# Patient Record
Sex: Female | Born: 1965 | Race: White | Hispanic: No | Smoking: Current every day smoker
Health system: Southern US, Community
[De-identification: ages and names within clinical notes are randomized; demographics above are authoritative.]

## PROBLEM LIST (undated history)

## (undated) DIAGNOSIS — K219 Gastro-esophageal reflux disease without esophagitis: Secondary | ICD-10-CM

## (undated) DIAGNOSIS — N809 Endometriosis, unspecified: Secondary | ICD-10-CM

## (undated) DIAGNOSIS — K2950 Unspecified chronic gastritis without bleeding: Secondary | ICD-10-CM

## (undated) DIAGNOSIS — E079 Disorder of thyroid, unspecified: Secondary | ICD-10-CM

## (undated) DIAGNOSIS — J40 Bronchitis, not specified as acute or chronic: Secondary | ICD-10-CM

## (undated) DIAGNOSIS — IMO0002 Reserved for concepts with insufficient information to code with codable children: Secondary | ICD-10-CM

## (undated) DIAGNOSIS — Z9889 Other specified postprocedural states: Secondary | ICD-10-CM

## (undated) DIAGNOSIS — G4700 Insomnia, unspecified: Secondary | ICD-10-CM

## (undated) DIAGNOSIS — F419 Anxiety disorder, unspecified: Secondary | ICD-10-CM

## (undated) DIAGNOSIS — J189 Pneumonia, unspecified organism: Secondary | ICD-10-CM

## (undated) DIAGNOSIS — G8929 Other chronic pain: Secondary | ICD-10-CM

## (undated) DIAGNOSIS — R112 Nausea with vomiting, unspecified: Secondary | ICD-10-CM

## (undated) DIAGNOSIS — N200 Calculus of kidney: Secondary | ICD-10-CM

## (undated) DIAGNOSIS — F1910 Other psychoactive substance abuse, uncomplicated: Secondary | ICD-10-CM

## (undated) HISTORY — PX: KNEE ARTHROPLASTY: SHX992

## (undated) HISTORY — PX: OTHER SURGICAL HISTORY: SHX169

## (undated) HISTORY — PX: ABDOMINAL HYSTERECTOMY: SHX81

## (undated) HISTORY — PX: FINGER ARTHRODESIS: SHX5000

## (undated) HISTORY — PX: CHOLECYSTECTOMY: SHX55

## (undated) HISTORY — PX: APPENDECTOMY: SHX54

---

## 2012-02-24 ENCOUNTER — Emergency Department: Payer: Self-pay | Admitting: Emergency Medicine

## 2012-02-25 LAB — COMPREHENSIVE METABOLIC PANEL
Albumin: 3.4 g/dL (ref 3.4–5.0)
Alkaline Phosphatase: 138 U/L — ABNORMAL HIGH (ref 50–136)
Calcium, Total: 8.3 mg/dL — ABNORMAL LOW (ref 8.5–10.1)
Chloride: 104 mmol/L (ref 98–107)
Co2: 23 mmol/L (ref 21–32)
Osmolality: 273 (ref 275–301)
Potassium: 3.5 mmol/L (ref 3.5–5.1)
SGOT(AST): 18 U/L (ref 15–37)
Sodium: 137 mmol/L (ref 136–145)
Total Protein: 6.9 g/dL (ref 6.4–8.2)

## 2012-02-25 LAB — DRUG SCREEN, URINE
Amphetamines, Ur Screen: NEGATIVE (ref ?–1000)
Barbiturates, Ur Screen: NEGATIVE (ref ?–200)
Cannabinoid 50 Ng, Ur ~~LOC~~: POSITIVE (ref ?–50)
MDMA (Ecstasy)Ur Screen: POSITIVE (ref ?–500)
Methadone, Ur Screen: NEGATIVE (ref ?–300)
Opiate, Ur Screen: NEGATIVE (ref ?–300)
Tricyclic, Ur Screen: POSITIVE (ref ?–1000)

## 2012-02-25 LAB — URINALYSIS, COMPLETE
Bilirubin,UR: NEGATIVE
Glucose,UR: NEGATIVE mg/dL (ref 0–75)
Ketone: NEGATIVE
Nitrite: NEGATIVE
Ph: 6 (ref 4.5–8.0)
Protein: NEGATIVE
Specific Gravity: 1.008 (ref 1.003–1.030)

## 2012-02-25 LAB — CBC
Platelet: 184 10*3/uL (ref 150–440)
RBC: 3.9 10*6/uL (ref 3.80–5.20)
RDW: 14.4 % (ref 11.5–14.5)
WBC: 7.6 10*3/uL (ref 3.6–11.0)

## 2012-02-25 LAB — CK TOTAL AND CKMB (NOT AT ARMC): CK-MB: 1.4 ng/mL (ref 0.5–3.6)

## 2012-03-30 ENCOUNTER — Encounter (HOSPITAL_BASED_OUTPATIENT_CLINIC_OR_DEPARTMENT_OTHER): Payer: Self-pay | Admitting: *Deleted

## 2012-03-30 ENCOUNTER — Emergency Department (HOSPITAL_BASED_OUTPATIENT_CLINIC_OR_DEPARTMENT_OTHER)
Admission: EM | Admit: 2012-03-30 | Discharge: 2012-03-30 | Disposition: A | Discharge status: Home / Self Care | Payer: Self-pay | Attending: Emergency Medicine | Admitting: Emergency Medicine

## 2012-03-30 DIAGNOSIS — N898 Other specified noninflammatory disorders of vagina: Secondary | ICD-10-CM | POA: Insufficient documentation

## 2012-03-30 DIAGNOSIS — Z79899 Other long term (current) drug therapy: Secondary | ICD-10-CM | POA: Insufficient documentation

## 2012-03-30 DIAGNOSIS — Z8659 Personal history of other mental and behavioral disorders: Secondary | ICD-10-CM | POA: Insufficient documentation

## 2012-03-30 DIAGNOSIS — Z90710 Acquired absence of both cervix and uterus: Secondary | ICD-10-CM | POA: Insufficient documentation

## 2012-03-30 DIAGNOSIS — R109 Unspecified abdominal pain: Secondary | ICD-10-CM | POA: Insufficient documentation

## 2012-03-30 HISTORY — DX: Anxiety disorder, unspecified: F41.9

## 2012-03-30 LAB — URINALYSIS, ROUTINE W REFLEX MICROSCOPIC
Bilirubin Urine: NEGATIVE
Glucose, UA: NEGATIVE mg/dL
Ketones, ur: NEGATIVE mg/dL
Protein, ur: NEGATIVE mg/dL

## 2012-03-30 LAB — URINE MICROSCOPIC-ADD ON

## 2012-03-30 NOTE — ED Notes (Signed)
Pt. Reports she has not had a period since 2009.  Pt. Reports hysterectomy in 2009.  Pt. Is color is WNL

## 2012-03-30 NOTE — ED Notes (Signed)
Vaginal bleeding started after having sex earlier today. States she feels like her bladder is falling out.

## 2012-03-30 NOTE — ED Provider Notes (Signed)
History     CSN: [id number]  Arrival date & time 03/30/12  1929   First MD Initiated Contact with Patient 03/30/12 2004      Chief Complaint  Patient presents with  . Vaginal Bleeding    (Consider location/radiation/quality/duration/timing/severity/associated sxs/prior treatment) HPI Comments: Patient with history of hysterectomy 5 years ago due to endometriosis.  Today had sex for the first time in a while.  Shortly after it began she started with bleeding from the vagina that has persisted all day.  No fever or chills.  No urinary complaints.    Patient is a 47 y.o. female presenting with vaginal bleeding. The history is provided by the patient.  Vaginal Bleeding This is a new problem. Episode onset: this afternoon. The problem occurs constantly. The problem has not changed since onset.Associated symptoms include abdominal pain. Nothing aggravates the symptoms. Nothing relieves the symptoms. She has tried nothing for the symptoms.    Past Medical History  Diagnosis Date  . Anxiety     Past Surgical History  Procedure Laterality Date  . Abdominal hysterectomy    . Cholecystectomy    . Appendectomy      No family history on file.  History  Substance Use Topics  . Smoking status: Not on file  . Smokeless tobacco: Not on file  . Alcohol Use: Not on file    OB History   Grav Para Term Preterm Abortions TAB SAB Ect Mult Living                  Review of Systems  Gastrointestinal: Positive for abdominal pain.  Genitourinary: Positive for vaginal bleeding.  All other systems reviewed and are negative.    Allergies  Review of patient's allergies indicates not on file.  Home Medications   Current Outpatient Rx  Name  Route  Sig  Dispense  Refill  . Methocarbamol (ROBAXIN PO)   Oral   Take by mouth.         . QUEtiapine Fumarate (SEROQUEL PO)   Oral   Take by mouth.         . TRAZODONE HCL PO   Oral   Take by mouth.           BP 142/92  Pulse  100  Temp(Src) 98.1 F (36.7 C) (Oral)  Resp 18  Wt 145 lb (65.772 kg)  SpO2 100%  Physical Exam  Nursing note and vitals reviewed. Constitutional: She is oriented to person, place, and time. She appears well-developed and well-nourished. No distress.  HENT:  Head: Normocephalic and atraumatic.  Neck: Normal range of motion. Neck supple.  Cardiovascular: Normal rate and regular rhythm.  Exam reveals no gallop and no friction rub.   No murmur heard. Pulmonary/Chest: Effort normal and breath sounds normal. No respiratory distress. She has no wheezes.  Abdominal: Soft. Bowel sounds are normal. She exhibits no distension. There is no tenderness.  Genitourinary: Vagina normal.  There is slight old blood in the vaginal vault but no active bleeding or bright red blood.  Musculoskeletal: Normal range of motion.  Neurological: She is alert and oriented to person, place, and time.  Skin: Skin is warm and dry. She is not diaphoretic.    ED Course  Procedures (including critical care time)  Labs Reviewed  URINALYSIS, ROUTINE W REFLEX MICROSCOPIC   No results found.   No diagnosis found.    MDM  The pelvic exam shows old blood but no obvious source or active bleeding.  She was exquisitely tender with insertion of the speculum.  I do not see anything emergent at this point.  I will advise her to follow up with gyn and call tomorrow to arrange this.          Geoffery Lyons, MD 03/30/12 [phone]

## 2012-04-10 ENCOUNTER — Encounter (HOSPITAL_BASED_OUTPATIENT_CLINIC_OR_DEPARTMENT_OTHER): Payer: Self-pay

## 2012-04-10 ENCOUNTER — Other Ambulatory Visit: Payer: Self-pay

## 2012-04-10 ENCOUNTER — Emergency Department (HOSPITAL_BASED_OUTPATIENT_CLINIC_OR_DEPARTMENT_OTHER)
Admission: EM | Admit: 2012-04-10 | Discharge: 2012-04-10 | Disposition: A | Discharge status: Home / Self Care | Payer: Self-pay | Attending: Emergency Medicine | Admitting: Emergency Medicine

## 2012-04-10 ENCOUNTER — Emergency Department (HOSPITAL_BASED_OUTPATIENT_CLINIC_OR_DEPARTMENT_OTHER): Payer: Self-pay

## 2012-04-10 DIAGNOSIS — Z862 Personal history of diseases of the blood and blood-forming organs and certain disorders involving the immune mechanism: Secondary | ICD-10-CM | POA: Insufficient documentation

## 2012-04-10 DIAGNOSIS — R000 Tachycardia, unspecified: Secondary | ICD-10-CM

## 2012-04-10 DIAGNOSIS — M545 Low back pain, unspecified: Secondary | ICD-10-CM | POA: Insufficient documentation

## 2012-04-10 DIAGNOSIS — F1911 Other psychoactive substance abuse, in remission: Secondary | ICD-10-CM

## 2012-04-10 DIAGNOSIS — Z79899 Other long term (current) drug therapy: Secondary | ICD-10-CM | POA: Insufficient documentation

## 2012-04-10 DIAGNOSIS — Z8639 Personal history of other endocrine, nutritional and metabolic disease: Secondary | ICD-10-CM | POA: Insufficient documentation

## 2012-04-10 DIAGNOSIS — R002 Palpitations: Secondary | ICD-10-CM

## 2012-04-10 DIAGNOSIS — G8929 Other chronic pain: Secondary | ICD-10-CM

## 2012-04-10 DIAGNOSIS — F17200 Nicotine dependence, unspecified, uncomplicated: Secondary | ICD-10-CM | POA: Insufficient documentation

## 2012-04-10 DIAGNOSIS — R079 Chest pain, unspecified: Secondary | ICD-10-CM

## 2012-04-10 DIAGNOSIS — Z8659 Personal history of other mental and behavioral disorders: Secondary | ICD-10-CM | POA: Insufficient documentation

## 2012-04-10 HISTORY — DX: Disorder of thyroid, unspecified: E07.9

## 2012-04-10 HISTORY — DX: Other psychoactive substance abuse, uncomplicated: F19.10

## 2012-04-10 LAB — CBC WITH DIFFERENTIAL/PLATELET
Basophils Relative: 0 % (ref 0–1)
Eosinophils Relative: 1 % (ref 0–5)
Lymphocytes Relative: 30 % (ref 12–46)
Lymphs Abs: 1.9 10*3/uL (ref 0.7–4.0)
MCH: 30.7 pg (ref 26.0–34.0)
Monocytes Absolute: 0.4 10*3/uL (ref 0.1–1.0)
Neutrophils Relative %: 63 % (ref 43–77)
Platelets: 254 10*3/uL (ref 150–400)
RBC: 4.27 MIL/uL (ref 3.87–5.11)
RDW: 14.3 % (ref 11.5–15.5)
WBC: 6.4 10*3/uL (ref 4.0–10.5)

## 2012-04-10 LAB — URINALYSIS, ROUTINE W REFLEX MICROSCOPIC
Leukocytes, UA: NEGATIVE
Nitrite: NEGATIVE
Specific Gravity, Urine: 1.01 (ref 1.005–1.030)
pH: 7.5 (ref 5.0–8.0)

## 2012-04-10 LAB — BASIC METABOLIC PANEL
Calcium: 10 mg/dL (ref 8.4–10.5)
GFR calc Af Amer: >90 mL/min (ref >90)
GFR calc non Af Amer: >90 mL/min (ref >90)
Potassium: 3.2 mEq/L — ABNORMAL LOW (ref 3.5–5.1)
Sodium: 141 mEq/L (ref 135–145)
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
## 2012-04-10 NOTE — ED Notes (Signed)
Marcello Moores from lab called and sts cbc was clotted.

## 2012-04-10 NOTE — ED Notes (Addendum)
C/o "heart racing" and bilat flank pain x 4-5 days-pt states she was on thyroid med while in rehab then was taken off while in rehab approx 1 .5 months ago

## 2012-07-13 ENCOUNTER — Emergency Department (HOSPITAL_COMMUNITY)
Admission: EM | Admit: 2012-07-13 | Discharge: 2012-07-13 | Disposition: A | Discharge status: Home / Self Care | Payer: Self-pay | Attending: Emergency Medicine | Admitting: Emergency Medicine

## 2012-07-13 ENCOUNTER — Emergency Department (HOSPITAL_COMMUNITY): Payer: Self-pay

## 2012-07-13 ENCOUNTER — Encounter (HOSPITAL_COMMUNITY): Payer: Self-pay | Admitting: *Deleted

## 2012-07-13 DIAGNOSIS — Z87442 Personal history of urinary calculi: Secondary | ICD-10-CM | POA: Insufficient documentation

## 2012-07-13 DIAGNOSIS — Z90710 Acquired absence of both cervix and uterus: Secondary | ICD-10-CM | POA: Insufficient documentation

## 2012-07-13 DIAGNOSIS — Z8639 Personal history of other endocrine, nutritional and metabolic disease: Secondary | ICD-10-CM | POA: Insufficient documentation

## 2012-07-13 DIAGNOSIS — Z9089 Acquired absence of other organs: Secondary | ICD-10-CM | POA: Insufficient documentation

## 2012-07-13 DIAGNOSIS — F411 Generalized anxiety disorder: Secondary | ICD-10-CM | POA: Insufficient documentation

## 2012-07-13 DIAGNOSIS — F17200 Nicotine dependence, unspecified, uncomplicated: Secondary | ICD-10-CM | POA: Insufficient documentation

## 2012-07-13 DIAGNOSIS — Z79899 Other long term (current) drug therapy: Secondary | ICD-10-CM | POA: Insufficient documentation

## 2012-07-13 DIAGNOSIS — Z862 Personal history of diseases of the blood and blood-forming organs and certain disorders involving the immune mechanism: Secondary | ICD-10-CM | POA: Insufficient documentation

## 2012-07-13 DIAGNOSIS — R112 Nausea with vomiting, unspecified: Secondary | ICD-10-CM | POA: Insufficient documentation

## 2012-07-13 DIAGNOSIS — R109 Unspecified abdominal pain: Secondary | ICD-10-CM | POA: Insufficient documentation

## 2012-07-13 DIAGNOSIS — Z880 Allergy status to penicillin: Secondary | ICD-10-CM | POA: Insufficient documentation

## 2012-07-13 LAB — CBC WITH DIFFERENTIAL/PLATELET
Basophils Relative: 0 % (ref 0–1)
Eosinophils Absolute: 0.1 10*3/uL (ref 0.0–0.7)
HCT: 43.2 % (ref 36.0–46.0)
Hemoglobin: 15.1 g/dL — ABNORMAL HIGH (ref 12.0–15.0)
MCH: 30.9 pg (ref 26.0–34.0)
MCHC: 35 g/dL (ref 30.0–36.0)
Monocytes Absolute: 0.5 10*3/uL (ref 0.1–1.0)
Monocytes Relative: 6 % (ref 3–12)
Neutrophils Relative %: 49 % (ref 43–77)
RDW: 12.6 % (ref 11.5–15.5)

## 2012-07-13 LAB — POCT I-STAT, CHEM 8
Calcium, Ion: 1.2 mmol/L (ref 1.12–1.23)
Creatinine, Ser: 0.9 mg/dL (ref 0.50–1.10)
Glucose, Bld: 93 mg/dL (ref 70–99)
Hemoglobin: 15.6 g/dL — ABNORMAL HIGH (ref 12.0–15.0)
TCO2: 23 mmol/L (ref 0–100)

## 2012-07-13 LAB — URINALYSIS, ROUTINE W REFLEX MICROSCOPIC
Bilirubin Urine: NEGATIVE
Protein, ur: NEGATIVE mg/dL
Urobilinogen, UA: 0.2 mg/dL (ref 0.0–1.0)

## 2012-07-13 MED ORDER — HYDROMORPHONE HCL PF 1 MG/ML IJ SOLN
1.0000 mg | Freq: Once | INTRAMUSCULAR | Status: AC
  Administered 2012-07-13: 1 mg via INTRAVENOUS
  Filled 2012-07-13: qty 1

## 2012-07-13 MED ORDER — KETOROLAC TROMETHAMINE 30 MG/ML IJ SOLN
30.0000 mg | Freq: Once | INTRAMUSCULAR | Status: AC
  Administered 2012-07-13: 30 mg via INTRAVENOUS
  Filled 2012-07-13: qty 1

## 2012-07-13 MED ORDER — SODIUM CHLORIDE 0.9 % IV SOLN
Freq: Once | INTRAVENOUS | Status: AC
  Administered 2012-07-13: 17:00:00 via INTRAVENOUS

## 2012-07-13 MED ORDER — NAPROXEN 500 MG PO TABS: 500.0000 mg | ORAL_TABLET | Freq: Two times a day (BID) | ORAL | Status: DC

## 2012-07-13 MED ORDER — METOCLOPRAMIDE HCL 5 MG/ML IJ SOLN
10.0000 mg | Freq: Once | INTRAMUSCULAR | Status: AC
  Administered 2012-07-13: 10 mg via INTRAVENOUS
  Filled 2012-07-13: qty 2

## 2012-07-13 MED ORDER — HYDROCODONE-ACETAMINOPHEN 5-325 MG PO TABS: 1.0000 | ORAL_TABLET | Freq: Four times a day (QID) | ORAL | Status: DC | PRN

## 2012-07-13 MED ORDER — PROMETHAZINE HCL 25 MG/ML IJ SOLN
12.5000 mg | Freq: Once | INTRAMUSCULAR | Status: AC
  Administered 2012-07-13: 12.5 mg via INTRAVENOUS
  Filled 2012-07-13 (×2): qty 1

## 2012-07-13 MED ORDER — PROMETHAZINE HCL 25 MG PO TABS: 25.0000 mg | ORAL_TABLET | Freq: Four times a day (QID) | ORAL | Status: DC | PRN

## 2012-07-13 NOTE — ED Provider Notes (Signed)
History     CSN: [id number]  Arrival date & time 07/13/12  1549   First MD Initiated Contact with Patient 07/13/12 1610      Chief Complaint  Patient presents with  . Flank Pain    (Consider location/radiation/quality/duration/timing/severity/associated sxs/prior treatment) HPI [name] [name] is a 47 y.o. female who presents to ED with complaint of right flank pain radiating into right groin onset today. States pain is sharp, colicky. Took lortab with no improvement. Hx of kindey stones, feels the same. Admits to nausea, vomiting. Denies urinary symptoms, denies fever, no change in bowels. Pt has had multiple surgeries including appendectomy, cholecystectomy, hysterectomy. No other complaints. Nothing making symptoms better or worse.    Past Medical History  Diagnosis Date  . Anxiety   . Thyroid disease   . Substance abuse     Past Surgical History  Procedure Laterality Date  . Abdominal hysterectomy    . Cholecystectomy    . Appendectomy      No family history on file.  History  Substance Use Topics  . Smoking status: Current Every Day Smoker  . Smokeless tobacco: Not on file  . Alcohol Use: Yes    OB History   Grav Para Term Preterm Abortions TAB SAB Ect Mult Living                  Review of Systems  Constitutional: Negative for fever and chills.  Respiratory: Negative.   Cardiovascular: Negative.   Gastrointestinal: Positive for nausea, vomiting and abdominal pain. Negative for diarrhea, constipation and blood in stool.  Genitourinary: Positive for flank pain. Negative for dysuria, hematuria, vaginal bleeding, vaginal discharge, difficulty urinating and vaginal pain.  All other systems reviewed and are negative.    Allergies  Sulfa antibiotics; Zofran; Clindamycin/lincomycin; Morphine and related; and Penicillins  Home Medications   Current Outpatient Rx  Name  Route  Sig  Dispense  Refill  . HYDROcodone-acetaminophen (NORCO/VICODIN) 5-325 MG per  tablet   Oral   Take 1 tablet by mouth every 6 (six) hours as needed for pain.         Marland Kitchen QUEtiapine Fumarate (SEROQUEL PO)   Oral   Take 50 mg by mouth 2 (two) times daily.            BP 149/97  Pulse 115  Temp(Src) 98.3 F (36.8 C) (Oral)  Resp 20  SpO2 100%  Physical Exam  Nursing note and vitals reviewed. Constitutional: She is oriented to person, place, and time. She appears well-developed and well-nourished. No distress.  HENT:  Head: Normocephalic.  Eyes: Conjunctivae are normal.  Neck: Neck supple.  Cardiovascular: Normal rate, regular rhythm and normal heart sounds.   Pulmonary/Chest: Effort normal and breath sounds normal. No respiratory distress. She has no wheezes. She has no rales.  Abdominal: Soft. Bowel sounds are normal. She exhibits no distension. There is no tenderness. There is no rebound.  RUQ tenderness, right CVA tenderness  Neurological: She is alert and oriented to person, place, and time.  Skin: Skin is warm and dry.    ED Course  Procedures (including critical care time)  Pt with right flank pain, appears in a lot of pain. Hx of kindey stones. Ua, labs, ct abd/pelvis ordered.   Results for orders placed during the hospital encounter of 07/13/12  CBC WITH DIFFERENTIAL      Result Value Range   WBC 7.1  4.0 - 10.5 K/uL   RBC 4.89  3.87 - 5.11 MIL/uL  Hemoglobin 15.1 (*) 12.0 - 15.0 g/dL   HCT 40.9  81.1 - 91.4 %   MCV 88.3  78.0 - 100.0 fL   MCH 30.9  26.0 - 34.0 pg   MCHC 35.0  30.0 - 36.0 g/dL   RDW 78.2  95.6 - 21.3 %   Platelets 305  150 - 400 K/uL   Neutrophils Relative % 49  43 - 77 %   Neutro Abs 3.5  1.7 - 7.7 K/uL   Lymphocytes Relative 44  12 - 46 %   Lymphs Abs 3.1  0.7 - 4.0 K/uL   Monocytes Relative 6  3 - 12 %   Monocytes Absolute 0.5  0.1 - 1.0 K/uL   Eosinophils Relative 1  0 - 5 %   Eosinophils Absolute 0.1  0.0 - 0.7 K/uL   Basophils Relative 0  0 - 1 %   Basophils Absolute 0.0  0.0 - 0.1 K/uL  URINALYSIS,  ROUTINE W REFLEX MICROSCOPIC      Result Value Range   Color, Urine YELLOW  YELLOW   APPearance CLEAR  CLEAR   Specific Gravity, Urine 1.012  1.005 - 1.030   pH 6.0  5.0 - 8.0   Glucose, UA NEGATIVE  NEGATIVE mg/dL   Hgb urine dipstick TRACE (*) NEGATIVE   Bilirubin Urine NEGATIVE  NEGATIVE   Ketones, ur NEGATIVE  NEGATIVE mg/dL   Protein, ur NEGATIVE  NEGATIVE mg/dL   Urobilinogen, UA 0.2  0.0 - 1.0 mg/dL   Nitrite NEGATIVE  NEGATIVE   Leukocytes, UA NEGATIVE  NEGATIVE  URINE MICROSCOPIC-ADD ON      Result Value Range   Squamous Epithelial / LPF RARE  RARE   RBC / HPF 0-2  <3 RBC/hpf   Bacteria, UA FEW (*) RARE  POCT I-STAT, CHEM 8      Result Value Range   Sodium 138  135 - 145 mEq/L   Potassium 4.8  3.5 - 5.1 mEq/L   Chloride 107  96 - 112 mEq/L   BUN 11  6 - 23 mg/dL   Creatinine, Ser 0.86  0.50 - 1.10 mg/dL   Glucose, Bld 93  70 - 99 mg/dL   Calcium, Ion 5.78  1.12 - 1.23 mmol/L   TCO2 23  0 - 100 mmol/L   Hemoglobin 15.6 (*) 12.0 - 15.0 g/dL   HCT 46.9  62.9 - 52.8 %   Ct Abdomen Pelvis Wo Contrast  07/13/2012   *RADIOLOGY REPORT*  Clinical Data: Right flank pain  CT ABDOMEN AND PELVIS WITHOUT CONTRAST  Technique:  Multidetector CT imaging of the abdomen and pelvis was performed following the standard protocol without intravenous contrast.  Comparison: Prior CT urogram 04/19/2008  Findings:  Lower Chest:  Lung bases are clear.  Visualized cardiac structures within normal limits for size.  No pericardial effusion. Unremarkable distal thoracic esophagus.  Abdomen: Unenhanced CT was performed per clinician order.  Lack of IV contrast limits sensitivity and specificity, especially for evaluation of abdominal/pelvic solid viscera.  Within these limitations, unremarkable CT appearance of the stomach, duodenum, spleen, adrenal glands and pancreas.  No focal hepatic lesion. Trace geographic hypoattenuation in the medial segment the left hepatic lobe adjacent to the fissure for the  falciform ligament likely reflects focal fatty infiltration.  Status post cholecystectomy.  Unremarkable appearance of the kidneys bilaterally.  No hydronephrosis or nephrolithiasis.  No focal renal contour abnormality.  Normal-caliber large and small bowel throughout the abdomen.  No evidence of obstruction.  No significant diverticular disease. Surgical clips at the base of the cecum suggest prior appendectomy. Mild fecalization of the terminal ileum.  No free fluid or suspicious adenopathy.  Pelvis: The bladder is distant with urine.  Surgical changes of prior hysterectomy.  No free fluid or suspicious adenopathy.  Bones: No acute fracture or aggressive appearing lytic or blastic osseous lesion.  Subacute or remote fracture of the right eleventh rib.  Vascular: No significant atherosclerotic vascular disease or other acute abnormality.  IMPRESSION:  1.  No acute findings in the abdomen or pelvis to explain the patient's clinical symptoms.  Specifically, no renal or ureteral stones.  2.  Surgical changes of prior cholecystectomy, appendectomy and hysterectomy.   Original Report Authenticated By: Malachy Moan, M.D.      1. Flank pain       MDM  Pt's labs, ua, ct unremarkable. Pt's pain controled with dilaudid and phenergan in ED. Given normal bowels on non contrasted scan, no hydronephrosis, no visible kidney stones, hx of cholecystectomy and appendectomy, suspect source of pain is musculoskeletal. Will start on nsaids, norco, phenergan, follow up.   Filed Vitals:   07/13/12 1601  BP: 149/97  Pulse: 115  Temp: 98.3 F (36.8 C)  TempSrc: Oral  Resp: 20  SpO2: 100%         Lottie Mussel, PA-C 07/13/12 1908

## 2012-07-13 NOTE — ED Notes (Signed)
Pt reports R flank pain radiating down to he R groin which started this am.  Pt has hx of kidney stone in the past-same side.  Pt reports calcification in her R kidney.  Pt reports taking one lortab x 3 hours ago.

## 2012-07-13 NOTE — ED Provider Notes (Signed)
Medical screening examination/treatment/procedure(s) were performed by non-physician practitioner and as supervising physician I was immediately available for consultation/collaboration.   Clary Boulais, MD 07/13/12 2246 

## 2012-07-14 ENCOUNTER — Encounter (HOSPITAL_COMMUNITY): Payer: Self-pay | Admitting: Emergency Medicine

## 2012-07-14 ENCOUNTER — Emergency Department (HOSPITAL_COMMUNITY)
Admission: EM | Admit: 2012-07-14 | Discharge: 2012-07-14 | Disposition: A | Discharge status: Home / Self Care | Payer: Self-pay | Attending: Emergency Medicine | Admitting: Emergency Medicine

## 2012-07-14 DIAGNOSIS — F17200 Nicotine dependence, unspecified, uncomplicated: Secondary | ICD-10-CM | POA: Insufficient documentation

## 2012-07-14 DIAGNOSIS — Z8639 Personal history of other endocrine, nutritional and metabolic disease: Secondary | ICD-10-CM | POA: Insufficient documentation

## 2012-07-14 DIAGNOSIS — X58XXXA Exposure to other specified factors, initial encounter: Secondary | ICD-10-CM | POA: Insufficient documentation

## 2012-07-14 DIAGNOSIS — S00209A Unspecified superficial injury of unspecified eyelid and periocular area, initial encounter: Secondary | ICD-10-CM | POA: Insufficient documentation

## 2012-07-14 DIAGNOSIS — F1910 Other psychoactive substance abuse, uncomplicated: Secondary | ICD-10-CM | POA: Insufficient documentation

## 2012-07-14 DIAGNOSIS — Z880 Allergy status to penicillin: Secondary | ICD-10-CM | POA: Insufficient documentation

## 2012-07-14 DIAGNOSIS — Y9301 Activity, walking, marching and hiking: Secondary | ICD-10-CM | POA: Insufficient documentation

## 2012-07-14 DIAGNOSIS — Z79899 Other long term (current) drug therapy: Secondary | ICD-10-CM | POA: Insufficient documentation

## 2012-07-14 DIAGNOSIS — Z8659 Personal history of other mental and behavioral disorders: Secondary | ICD-10-CM | POA: Insufficient documentation

## 2012-07-14 DIAGNOSIS — Y9289 Other specified places as the place of occurrence of the external cause: Secondary | ICD-10-CM | POA: Insufficient documentation

## 2012-07-14 DIAGNOSIS — S00212A Abrasion of left eyelid and periocular area, initial encounter: Secondary | ICD-10-CM

## 2012-07-14 DIAGNOSIS — Z862 Personal history of diseases of the blood and blood-forming organs and certain disorders involving the immune mechanism: Secondary | ICD-10-CM | POA: Insufficient documentation

## 2012-07-14 MED ORDER — TETRACAINE HCL 0.5 % OP SOLN
1.0000 [drp] | Freq: Once | OPHTHALMIC | Status: AC
  Administered 2012-07-14: 1 [drp] via OPHTHALMIC
  Filled 2012-07-14: qty 2

## 2012-07-14 MED ORDER — ERYTHROMYCIN 5 MG/GM OP OINT
TOPICAL_OINTMENT | Freq: Four times a day (QID) | OPHTHALMIC | Status: DC
  Administered 2012-07-14: 11:00:00 via OPHTHALMIC
  Filled 2012-07-14: qty 3.5

## 2012-07-14 MED ORDER — OXYBUTYNIN CHLORIDE ER 5 MG PO TB24: 5.0000 mg | ORAL_TABLET | Freq: Every day | ORAL | Status: DC

## 2012-07-14 NOTE — ED Notes (Signed)
Pt sts that she was walking and she walked into a bush. Pt sts that she thinks something is in her L eye. Pt has pain in this eye without any trouble seeing.

## 2012-07-14 NOTE — ED Provider Notes (Signed)
  Medical screening examination/treatment/procedure(s) were performed by non-physician practitioner and as supervising physician I was immediately available for consultation/collaboration.   Walton Digilio, MD 07/14/12 1629 

## 2012-07-14 NOTE — ED Provider Notes (Signed)
History     CSN: [id number]  Arrival date & time 07/14/12  1035   First MD Initiated Contact with Patient 07/14/12 1052      Chief Complaint  Patient presents with  . Eye Pain    (Consider location/radiation/quality/duration/timing/severity/associated sxs/prior treatment) HPI Comments: Pt states that she turned and scraped her eye on a bush:her left eye is tearing and it feels like something is in it:pt is also requesting script for ditropan until she can see her doctor  Patient is a 47 y.o. female presenting with eye pain. The history is provided by the patient. No language interpreter was used.  Eye Pain This is a new problem. The current episode started yesterday. The problem occurs constantly. The problem has been unchanged. Pertinent negatives include no fever. Nothing aggravates the symptoms. Treatments tried: rinsing.    Past Medical History  Diagnosis Date  . Anxiety   . Thyroid disease   . Substance abuse     Past Surgical History  Procedure Laterality Date  . Abdominal hysterectomy    . Cholecystectomy    . Appendectomy      No family history on file.  History  Substance Use Topics  . Smoking status: Current Every Day Smoker  . Smokeless tobacco: Not on file  . Alcohol Use: Yes    OB History   Grav Para Term Preterm Abortions TAB SAB Ect Mult Living                  Review of Systems  Constitutional: Negative for fever.  Eyes: Positive for pain.  Respiratory: Negative.   Cardiovascular: Negative.     Allergies  Sulfa antibiotics; Zofran; Clindamycin/lincomycin; Morphine and related; and Penicillins  Home Medications   Current Outpatient Rx  Name  Route  Sig  Dispense  Refill  . HYDROcodone-acetaminophen (NORCO) 5-325 MG per tablet   Oral   Take 1 tablet by mouth every 6 (six) hours as needed for pain.   20 tablet   0   . HYDROcodone-acetaminophen (NORCO/VICODIN) 5-325 MG per tablet   Oral   Take 1 tablet by mouth every 6 (six) hours  as needed for pain.         . naproxen (NAPROSYN) 500 MG tablet   Oral   Take 1 tablet (500 mg total) by mouth 2 (two) times daily.   30 tablet   0   . promethazine (PHENERGAN) 25 MG tablet   Oral   Take 1 tablet (25 mg total) by mouth every 6 (six) hours as needed for nausea.   15 tablet   0   . QUEtiapine Fumarate (SEROQUEL PO)   Oral   Take 50 mg by mouth 2 (two) times daily.            BP 133/88  Pulse 85  Temp(Src) 99 F (37.2 C) (Oral)  SpO2 100%  Physical Exam  Nursing note and vitals reviewed. Constitutional: She appears well-developed and well-nourished.  Eyes: EOM are normal. Pupils are equal, round, and reactive to light. No foreign bodies found.  Slit lamp exam:      The left eye shows no corneal abrasion, no corneal ulcer, no foreign body and no fluorescein uptake.  Pt has abrasion under the left eyelid  Cardiovascular: Normal rate and regular rhythm.   Pulmonary/Chest: Effort normal and breath sounds normal.  Musculoskeletal: Normal range of motion.    ED Course  Procedures (including critical care time)  Labs Reviewed - No data to  display    1. Eyelid abrasion, left, initial encounter       MDM  Will treat with antibiotics and give ditropan refill        Teressa Lower, NP 07/14/12 1121

## 2012-07-14 NOTE — Progress Notes (Signed)
Consulted by [facility] liaison, Stacy  Provided pt with a list of uniinsured dental services for [facility] (where pt informed stacy she would be moving to)   [place], [employer] ([facility], [facility], [facility]) [place], [place] Medicaid Dentist  [facility] - [phone] [place], [place] - [address]  [place],  Medicaid Dentist  [facility]: 10.93 miles from [address] Jake Michaelis, Elbert Ewings - [phone] [place], [place] - [address]  Liana Crocker, [place] Medicaid Dentist  [facility]: 14.37 miles from [facility] - [phone] [place], [place] - [address]  [place], [place] Medicaid Dentist  [facility]: 19.93 miles from [address] Wynona Dove, [place] - [phone] [place], [place] - [address]  Henrene Dodge, [place] [facility]  [facility]: 20.00 miles from [facility] - [phone] [place], [place] - [address]  Karn Pickler, [place] Medicaid Dentist  [facility]: 25.52 miles from [address] Layla Maw, B - [phone] [place], [place] - [address]   Pt appreciative of resources provided

## 2012-07-17 ENCOUNTER — Encounter (HOSPITAL_COMMUNITY): Payer: Self-pay

## 2012-07-17 ENCOUNTER — Other Ambulatory Visit: Payer: Self-pay

## 2012-07-17 ENCOUNTER — Emergency Department (HOSPITAL_COMMUNITY): Payer: Self-pay

## 2012-07-17 ENCOUNTER — Emergency Department (HOSPITAL_COMMUNITY)
Admission: EM | Admit: 2012-07-17 | Discharge: 2012-07-17 | Disposition: A | Discharge status: Home / Self Care | Payer: Self-pay | Attending: Emergency Medicine | Admitting: Emergency Medicine

## 2012-07-17 DIAGNOSIS — Z720 Tobacco use: Secondary | ICD-10-CM

## 2012-07-17 DIAGNOSIS — Z8701 Personal history of pneumonia (recurrent): Secondary | ICD-10-CM | POA: Insufficient documentation

## 2012-07-17 DIAGNOSIS — R079 Chest pain, unspecified: Secondary | ICD-10-CM | POA: Insufficient documentation

## 2012-07-17 DIAGNOSIS — Z8742 Personal history of other diseases of the female genital tract: Secondary | ICD-10-CM | POA: Insufficient documentation

## 2012-07-17 DIAGNOSIS — R509 Fever, unspecified: Secondary | ICD-10-CM | POA: Insufficient documentation

## 2012-07-17 DIAGNOSIS — J209 Acute bronchitis, unspecified: Secondary | ICD-10-CM | POA: Insufficient documentation

## 2012-07-17 DIAGNOSIS — Z8659 Personal history of other mental and behavioral disorders: Secondary | ICD-10-CM | POA: Insufficient documentation

## 2012-07-17 DIAGNOSIS — F17200 Nicotine dependence, unspecified, uncomplicated: Secondary | ICD-10-CM | POA: Insufficient documentation

## 2012-07-17 DIAGNOSIS — Z87442 Personal history of urinary calculi: Secondary | ICD-10-CM | POA: Insufficient documentation

## 2012-07-17 DIAGNOSIS — IMO0002 Reserved for concepts with insufficient information to code with codable children: Secondary | ICD-10-CM | POA: Insufficient documentation

## 2012-07-17 DIAGNOSIS — F411 Generalized anxiety disorder: Secondary | ICD-10-CM | POA: Insufficient documentation

## 2012-07-17 DIAGNOSIS — Z8639 Personal history of other endocrine, nutritional and metabolic disease: Secondary | ICD-10-CM | POA: Insufficient documentation

## 2012-07-17 DIAGNOSIS — Z862 Personal history of diseases of the blood and blood-forming organs and certain disorders involving the immune mechanism: Secondary | ICD-10-CM | POA: Insufficient documentation

## 2012-07-17 DIAGNOSIS — Z79899 Other long term (current) drug therapy: Secondary | ICD-10-CM | POA: Insufficient documentation

## 2012-07-17 DIAGNOSIS — J40 Bronchitis, not specified as acute or chronic: Secondary | ICD-10-CM

## 2012-07-17 HISTORY — DX: Calculus of kidney: N20.0

## 2012-07-17 HISTORY — DX: Endometriosis, unspecified: N80.9

## 2012-07-17 HISTORY — DX: Pneumonia, unspecified organism: J18.9

## 2012-07-17 HISTORY — DX: Bronchitis, not specified as acute or chronic: J40

## 2012-07-17 MED ORDER — ALBUTEROL SULFATE HFA 108 (90 BASE) MCG/ACT IN AERS
2.0000 | INHALATION_SPRAY | RESPIRATORY_TRACT | Status: DC | PRN
  Administered 2012-07-17: 2 via RESPIRATORY_TRACT
  Filled 2012-07-17: qty 6.7

## 2012-07-17 MED ORDER — AZITHROMYCIN 250 MG PO TABS: ORAL_TABLET | ORAL | Status: DC

## 2012-07-17 MED ORDER — HYDROCOD POLST-CHLORPHEN POLST 10-8 MG/5ML PO LQCR: 5.0000 mL | Freq: Two times a day (BID) | ORAL | Status: DC

## 2012-07-17 MED ORDER — PREDNISONE 20 MG PO TABS
60.0000 mg | ORAL_TABLET | Freq: Once | ORAL | Status: AC
  Administered 2012-07-17: 60 mg via ORAL
  Filled 2012-07-17: qty 3

## 2012-07-17 MED ORDER — HYDROCODONE-ACETAMINOPHEN 5-325 MG PO TABS
1.0000 | ORAL_TABLET | Freq: Once | ORAL | Status: AC
  Administered 2012-07-17: 1 via ORAL
  Filled 2012-07-17: qty 1

## 2012-07-17 MED ORDER — AEROCHAMBER Z-STAT PLUS/MEDIUM MISC
1.0000 | Freq: Once | Status: AC
  Administered 2012-07-17: 1
  Filled 2012-07-17: qty 1

## 2012-07-17 MED ORDER — ONDANSETRON 8 MG PO TBDP
8.0000 mg | ORAL_TABLET | Freq: Once | ORAL | Status: AC
  Administered 2012-07-17: 8 mg via ORAL
  Filled 2012-07-17: qty 1

## 2012-07-17 MED ORDER — PREDNISONE 20 MG PO TABS: 20.0000 mg | ORAL_TABLET | Freq: Two times a day (BID) | ORAL | Status: DC

## 2012-07-17 NOTE — ED Provider Notes (Signed)
History     CSN: [id number]  Arrival date & time 07/17/12  6578   First MD Initiated Contact with Patient 07/17/12 1045      Chief Complaint  Patient presents with  . Fever  . Cough  . Chest Pain    (Consider location/radiation/quality/duration/timing/severity/associated sxs/prior treatment) HPI Comments: [name] [name] is a 47 y.o. Female who presents for evaluation of a productive cough for 3 days. She has had nausea and 2 episodes of vomiting for one day. She has mild, chest pain that is worse with coughing. She tried the [employer], but it did not help her cough. She has had history of similar problems with both bronchitis, and pneumonia. She continues to smoke one pack per day. She denies shortness of breath, weakness, dizziness, headache, or back pain. The vomiting seemed to be caused by coughing.There are no other known modifying factors.  Patient is a 47 y.o. female presenting with fever, cough, and chest pain. The history is provided by the patient.  Fever Associated symptoms: chest pain and cough   Cough Associated symptoms: chest pain and fever   Chest Pain Associated symptoms: cough and fever     Past Medical History  Diagnosis Date  . Anxiety   . Thyroid disease   . Substance abuse   . Pneumonia   . Bronchitis   . Kidney stone   . Endometriosis     Past Surgical History  Procedure Laterality Date  . Abdominal hysterectomy    . Cholecystectomy    . Appendectomy    . Ex laporotomy      Family History  Problem Relation Age of Onset  . Thyroid disease Mother   . Thyroid disease Sister     History  Substance Use Topics  . Smoking status: Current Every Day Smoker  . Smokeless tobacco: Never Used  . Alcohol Use: Yes     Comment: socially    OB History   Grav Para Term Preterm Abortions TAB SAB Ect Mult Living                  Review of Systems  Constitutional: Positive for fever.  Respiratory: Positive for cough.   Cardiovascular: Positive  for chest pain.  All other systems reviewed and are negative.    Allergies  Sulfa antibiotics; Reglan; Zofran; Clindamycin/lincomycin; Morphine and related; and Penicillins  Home Medications   Current Outpatient Rx  Name  Route  Sig  Dispense  Refill  . acetaminophen (TYLENOL) 500 MG tablet   Oral   Take 500 mg by mouth every 6 (six) hours as needed for pain.         . Benzonatate (TESSALON PERLES PO)   Oral   Take 1 capsule by mouth 3 (three) times daily as needed (for cough).         Marland Kitchen HYDROcodone-acetaminophen (NORCO/VICODIN) 5-325 MG per tablet   Oral   Take 1 tablet by mouth every 6 (six) hours as needed for pain.         Marland Kitchen oxybutynin (DITROPAN XL) 5 MG 24 hr tablet   Oral   Take 1 tablet (5 mg total) by mouth daily.   14 tablet   0   . phenylephrine (VICKS SINEX) 0.5 % nasal solution   Nasal   Place 1 drop into the nose every 4 (four) hours as needed for congestion.         . promethazine (PHENERGAN) 25 MG tablet   Oral   Take  12.5-25 mg by mouth every 6 (six) hours as needed for nausea.         Marland Kitchen QUEtiapine (SEROQUEL) 50 MG tablet   Oral   Take 50 mg by mouth 3 (three) times daily.         Marland Kitchen azithromycin (ZITHROMAX Z-PAK) 250 MG tablet      2 po day one, then 1 daily x 4 days   5 tablet   0   . chlorpheniramine-HYDROcodone (TUSSIONEX PENNKINETIC ER) 10-8 MG/5ML LQCR   Oral   Take 5 mLs by mouth every 12 (twelve) hours.   140 mL   0   . predniSONE (DELTASONE) 20 MG tablet   Oral   Take 1 tablet (20 mg total) by mouth 2 (two) times daily.   10 tablet   0     BP 118/71  Pulse 84  Temp(Src) 98.6 F (37 C) (Oral)  Resp 16  Ht 5\' 4"  (1.626 m)  Wt 154 lb 4 oz (69.967 kg)  BMI 26.46 kg/m2  SpO2 93%  Physical Exam  Nursing note and vitals reviewed. Constitutional: She is oriented to person, place, and time. She appears well-developed and well-nourished.  HENT:  Head: Normocephalic and atraumatic.  Eyes: Conjunctivae and EOM are  normal. Pupils are equal, round, and reactive to light.  Neck: Normal range of motion and phonation normal. Neck supple.  Cardiovascular: Normal rate, regular rhythm and intact distal pulses.   Pulmonary/Chest: Effort normal. She has no rales. She exhibits no tenderness.  Decreased expiratory airflow bilaterally with scattered rhonchi. Few scattered wheezes. No increased work of breathing.  Abdominal: Soft. She exhibits no distension. There is no tenderness. There is no guarding.  Musculoskeletal: Normal range of motion.  Neurological: She is alert and oriented to person, place, and time. She has normal strength. She exhibits normal muscle tone.  Skin: Skin is warm and dry.  Psychiatric: She has a normal mood and affect. Her behavior is normal. Judgment and thought content normal.    ED Course  Procedures (including critical care time)   Medications  albuterol (PROVENTIL HFA;VENTOLIN HFA) 108 (90 BASE) MCG/ACT inhaler 2 puff (2 puffs Inhalation Given 07/17/12 1144)  aerochamber Z-Stat Plus/medium 1 each (1 each Other Given 07/17/12 1145)  ondansetron (ZOFRAN-ODT) disintegrating tablet 8 mg (8 mg Oral Given 07/17/12 1152)  HYDROcodone-acetaminophen (NORCO/VICODIN) 5-325 MG per tablet 1 tablet (1 tablet Oral Given 07/17/12 1144)  predniSONE (DELTASONE) tablet 60 mg (60 mg Oral Given 07/17/12 1143)  HYDROcodone-acetaminophen (NORCO/VICODIN) 5-325 MG per tablet 1 tablet (1 tablet Oral Given 07/17/12 1620)    Patient Vitals for the past 24 hrs:  BP Temp Temp src Pulse Resp SpO2 Height Weight  07/17/12 1545 118/71 mmHg - - 84 16 93 % - -  07/17/12 1515 118/74 mmHg - - 84 17 92 % - -  07/17/12 1500 110/72 mmHg - - 83 16 93 % - -  07/17/12 1445 113/72 mmHg - - 79 14 90 % - -  07/17/12 1430 107/82 mmHg - - 86 16 90 % - -  07/17/12 1415 129/70 mmHg - - 83 19 89 % - -  07/17/12 1400 113/69 mmHg - - 90 24 93 % - -  07/17/12 1345 107/64 mmHg - - 82 12 93 % - -  07/17/12 1330 112/69 mmHg - - 80 20 95  % - -  07/17/12 1315 115/71 mmHg - - 81 21 93 % - -  07/17/12 1300 112/67 mmHg - -  84 15 92 % - -  07/17/12 1245 110/63 mmHg - - 83 17 91 % - -  07/17/12 1230 113/67 mmHg - - 82 16 90 % - -  07/17/12 1215 116/72 mmHg - - 85 14 93 % - -  07/17/12 1200 111/68 mmHg - - 87 18 93 % - -  07/17/12 1130 112/75 mmHg - - 88 19 93 % - -  07/17/12 1123 123/70 mmHg 98.6 F (37 C) Oral 90 - 94 % - -  07/17/12 1009 127/75 mmHg 99.5 F (37.5 C) Oral 118 20 96 % 5\' 4"  (1.626 m) 154 lb 4 oz (69.967 kg)  07/17/12 1007 116/76 mmHg 99.5 F (37.5 C) Oral 118 14 96 % - -    3:38 PM Reevaluation with update and discussion. After initial assessment and treatment, an updated evaluation reveals she feels better, and would like something more for cough. Will give a single dose of Norco prior to discharge. She was to use a liquid anti-tussive at home.   Daejah Klebba L   Labs Reviewed - No data to display Dg Chest 2 View  07/17/2012   *RADIOLOGY REPORT*  Clinical Data: Fever, cough  CHEST - 2 VIEW  Comparison: 04/10/2012  Findings: Cardiomediastinal silhouette is unremarkable.  No pulmonary edema.  There is linear atelectasis or early infiltrate left base.  Linear atelectasis noted right base.  IMPRESSION: Linear atelectasis or early infiltrate left base.  Linear atelectasis right base.  No pulmonary edema.   Original Report Authenticated By: Natasha Mead, M.D.     1. Bronchitis   2. Tobacco abuse       MDM  Evaluation consistent with bronchitis. Doubt pneumonia, ACS, or PE. Doubt metabolic instability, serious bacterial infection or impending vascular collapse; the patient is stable for discharge.    Nursing Notes Reviewed/ Care Coordinated, and agree without changes. Applicable Imaging Reviewed.  Interpretation of Laboratory Data incorporated into ED treatment    Plan: Home Medications- Tussionex, prednisone, Zithromax; Home Treatments- rest, stop smoking. Return here if the recommended treatment does not  improve your symptoms; Recommended follow up- when necessary      Flint Melter, MD 07/17/12 [id number]

## 2012-07-17 NOTE — ED Notes (Addendum)
Patient reports fever, productive cough with brown sputum, vomiting x 1 and nausea, and intermittent central chest pain that is worse with coughing and movement. Patient states she took a relative's [employer], but they did not do any good. Patient states she has a history of pneumonia.

## 2012-07-17 NOTE — Progress Notes (Signed)
[facility] has seen patient. Patient was provided with a OC application, as well as, a list of primary care resources on her last visit to the ED on 07/14/12. During that visit patient stated that she was moving to [place] at the end of the month, CM then provided her with resources within the county that she is moving to. Today, [facility] has provided her with a list of primary care resources for [facility].

## 2012-07-26 ENCOUNTER — Emergency Department (HOSPITAL_COMMUNITY): Payer: Self-pay

## 2012-07-26 ENCOUNTER — Encounter (HOSPITAL_COMMUNITY): Payer: Self-pay | Admitting: Emergency Medicine

## 2012-07-26 ENCOUNTER — Inpatient Hospital Stay (HOSPITAL_COMMUNITY)
Admission: EM | Admit: 2012-07-26 | Discharge: 2012-07-29 | DRG: 195 | Disposition: A | Discharge status: Home / Self Care | Payer: MEDICAID | Attending: Internal Medicine | Admitting: Internal Medicine

## 2012-07-26 DIAGNOSIS — E860 Dehydration: Secondary | ICD-10-CM

## 2012-07-26 DIAGNOSIS — R112 Nausea with vomiting, unspecified: Secondary | ICD-10-CM | POA: Diagnosis present

## 2012-07-26 DIAGNOSIS — R197 Diarrhea, unspecified: Secondary | ICD-10-CM | POA: Diagnosis present

## 2012-07-26 DIAGNOSIS — R0902 Hypoxemia: Secondary | ICD-10-CM | POA: Diagnosis present

## 2012-07-26 DIAGNOSIS — M545 Low back pain: Secondary | ICD-10-CM

## 2012-07-26 DIAGNOSIS — F1210 Cannabis abuse, uncomplicated: Secondary | ICD-10-CM | POA: Diagnosis present

## 2012-07-26 DIAGNOSIS — J189 Pneumonia, unspecified organism: Secondary | ICD-10-CM | POA: Diagnosis present

## 2012-07-26 DIAGNOSIS — R5381 Other malaise: Secondary | ICD-10-CM | POA: Diagnosis present

## 2012-07-26 DIAGNOSIS — F1910 Other psychoactive substance abuse, uncomplicated: Secondary | ICD-10-CM

## 2012-07-26 DIAGNOSIS — F1911 Other psychoactive substance abuse, in remission: Secondary | ICD-10-CM

## 2012-07-26 DIAGNOSIS — E876 Hypokalemia: Secondary | ICD-10-CM | POA: Diagnosis present

## 2012-07-26 DIAGNOSIS — F17200 Nicotine dependence, unspecified, uncomplicated: Secondary | ICD-10-CM | POA: Diagnosis present

## 2012-07-26 LAB — CBC WITH DIFFERENTIAL/PLATELET
Basophils Relative: 0 % (ref 0–1)
Eosinophils Absolute: 0 10*3/uL (ref 0.0–0.7)
Eosinophils Relative: 0 % (ref 0–5)
HCT: 37.2 % (ref 36.0–46.0)
Hemoglobin: 13 g/dL (ref 12.0–15.0)
MCH: 30.7 pg (ref 26.0–34.0)
MCHC: 34.9 g/dL (ref 30.0–36.0)
Monocytes Absolute: 0.4 10*3/uL (ref 0.1–1.0)
Monocytes Relative: 6 % (ref 3–12)

## 2012-07-26 LAB — COMPREHENSIVE METABOLIC PANEL
Albumin: 3 g/dL — ABNORMAL LOW (ref 3.5–5.2)
BUN: 6 mg/dL (ref 6–23)
Chloride: 99 mEq/L (ref 96–112)
Creatinine, Ser: 0.61 mg/dL (ref 0.50–1.10)
Total Bilirubin: 0.2 mg/dL — ABNORMAL LOW (ref 0.3–1.2)
Total Protein: 7.3 g/dL (ref 6.0–8.3)

## 2012-07-26 MED ORDER — PROMETHAZINE HCL 25 MG/ML IJ SOLN
12.5000 mg | Freq: Once | INTRAMUSCULAR | Status: AC
  Administered 2012-07-26: 12.5 mg via INTRAVENOUS
  Filled 2012-07-26: qty 1

## 2012-07-26 MED ORDER — ENOXAPARIN SODIUM 40 MG/0.4ML ~~LOC~~ SOLN
40.0000 mg | Freq: Every day | SUBCUTANEOUS | Status: DC
  Administered 2012-07-27 (×2): 40 mg via SUBCUTANEOUS
  Filled 2012-07-26 (×3): qty 0.4

## 2012-07-26 MED ORDER — POTASSIUM CHLORIDE CRYS ER 20 MEQ PO TBCR
40.0000 meq | EXTENDED_RELEASE_TABLET | Freq: Once | ORAL | Status: AC
  Administered 2012-07-26: 40 meq via ORAL
  Filled 2012-07-26: qty 2

## 2012-07-26 MED ORDER — KETOROLAC TROMETHAMINE 30 MG/ML IJ SOLN
30.0000 mg | Freq: Once | INTRAMUSCULAR | Status: AC
  Administered 2012-07-26: 30 mg via INTRAVENOUS
  Filled 2012-07-26: qty 1

## 2012-07-26 MED ORDER — OXYBUTYNIN CHLORIDE ER 5 MG PO TB24
5.0000 mg | ORAL_TABLET | Freq: Every day | ORAL | Status: DC
  Administered 2012-07-27 – 2012-07-29 (×3): 5 mg via ORAL
  Filled 2012-07-26 (×3): qty 1

## 2012-07-26 MED ORDER — DOCUSATE SODIUM 100 MG PO CAPS
100.0000 mg | ORAL_CAPSULE | Freq: Two times a day (BID) | ORAL | Status: DC
  Administered 2012-07-27 (×2): 100 mg via ORAL
  Filled 2012-07-26 (×7): qty 1

## 2012-07-26 MED ORDER — DM-GUAIFENESIN ER 30-600 MG PO TB12
1.0000 | ORAL_TABLET | Freq: Once | ORAL | Status: AC
  Administered 2012-07-26: 1 via ORAL
  Filled 2012-07-26 (×2): qty 1

## 2012-07-26 MED ORDER — SODIUM CHLORIDE 0.9 % IV SOLN
INTRAVENOUS | Status: DC
  Administered 2012-07-26 – 2012-07-28 (×5): via INTRAVENOUS

## 2012-07-26 MED ORDER — HYDROMORPHONE HCL PF 1 MG/ML IJ SOLN
1.0000 mg | INTRAMUSCULAR | Status: DC | PRN
  Administered 2012-07-27 – 2012-07-29 (×20): 1 mg via INTRAVENOUS
  Filled 2012-07-26 (×19): qty 1

## 2012-07-26 MED ORDER — PROMETHAZINE HCL 25 MG PO TABS
25.0000 mg | ORAL_TABLET | ORAL | Status: DC | PRN
  Administered 2012-07-27 – 2012-07-28 (×3): 25 mg via ORAL
  Filled 2012-07-26 (×3): qty 1

## 2012-07-26 MED ORDER — SODIUM CHLORIDE 0.9 % IV BOLUS (SEPSIS)
1000.0000 mL | Freq: Once | INTRAVENOUS | Status: AC
  Administered 2012-07-26: 1000 mL via INTRAVENOUS

## 2012-07-26 MED ORDER — LEVOFLOXACIN IN D5W 500 MG/100ML IV SOLN
500.0000 mg | INTRAVENOUS | Status: DC
  Administered 2012-07-26 – 2012-07-28 (×3): 500 mg via INTRAVENOUS
  Filled 2012-07-26 (×4): qty 100

## 2012-07-26 MED ORDER — ALBUTEROL SULFATE (5 MG/ML) 0.5% IN NEBU: 2.5000 mg | INHALATION_SOLUTION | RESPIRATORY_TRACT | Status: DC | PRN

## 2012-07-26 MED ORDER — ACETAMINOPHEN 500 MG PO TABS
500.0000 mg | ORAL_TABLET | Freq: Four times a day (QID) | ORAL | Status: DC | PRN
  Administered 2012-07-28 – 2012-07-29 (×2): 500 mg via ORAL
  Filled 2012-07-26: qty 1
  Filled 2012-07-26: qty 2

## 2012-07-26 MED ORDER — BENZONATATE 100 MG PO CAPS
100.0000 mg | ORAL_CAPSULE | Freq: Three times a day (TID) | ORAL | Status: DC | PRN
  Administered 2012-07-28: 100 mg via ORAL
  Filled 2012-07-26: qty 1

## 2012-07-26 MED ORDER — POTASSIUM CHLORIDE CRYS ER 20 MEQ PO TBCR
40.0000 meq | EXTENDED_RELEASE_TABLET | Freq: Two times a day (BID) | ORAL | Status: DC
  Administered 2012-07-27: 40 meq via ORAL
  Filled 2012-07-26 (×3): qty 2

## 2012-07-26 MED ORDER — QUETIAPINE FUMARATE 50 MG PO TABS
50.0000 mg | ORAL_TABLET | Freq: Two times a day (BID) | ORAL | Status: DC
  Administered 2012-07-27 – 2012-07-29 (×6): 50 mg via ORAL
  Filled 2012-07-26 (×7): qty 1

## 2012-07-26 NOTE — ED Notes (Addendum)
Pt states she was seen 9 days ago here and has finished her z-pak for the URI but has since not felt any better. States she is still running a fever. 98.8 here but took 2 tylenol 3 hrs ago. Coughing up sputum and blood she states. Hurts to breathe. Pt has not been able to eat in past week very much either.

## 2012-07-26 NOTE — ED Notes (Signed)
Patient states that she is nauseated and in pain and cannot walk at this time.

## 2012-07-26 NOTE — ED Provider Notes (Signed)
History    CSN: [id number] Arrival date & time 07/26/12  1739  First MD Initiated Contact with Patient 07/26/12 1946     Chief Complaint  Patient presents with  . URI   (Consider location/radiation/quality/duration/timing/severity/associated sxs/prior Treatment) HPI Pt states she started having cough on 6/14 and was seen in the ED on 6/16 and was started on a zpak, steroids, inhaler and cough liquid. States she was feeling a little better until 4 days ago when she started having fever up to 102 with chills and started having vomiting and diarrhea. She states she's vomiting 2-3 times a day and having diarrhea about 3 times a day. She states she still coughing up green sputum that has streaks of  blood in it. She also has discomfort in her lower back her anterior chest feels sore. She feels short of breath and has dyspnea on exertion. She also describes lost appetite and she feels weak.  PCP none [facility]  Past Medical History  Diagnosis Date  . Anxiety   . Thyroid disease   . Substance abuse   . Pneumonia   . Bronchitis   . Kidney stone   . Endometriosis    Past Surgical History  Procedure Laterality Date  . Abdominal hysterectomy    . Cholecystectomy    . Appendectomy    . Ex laporotomy     Family History  Problem Relation Age of Onset  . Thyroid disease Mother   . Thyroid disease Sister    History  Substance Use Topics  . Smoking status: Current Every Day Smoker  . Smokeless tobacco: Never Used  . Alcohol Use: Yes     Comment: socially  smokes 1 ppd employed   OB History   Grav Para Term Preterm Abortions TAB SAB Ect Mult Living                 Review of Systems  All other systems reviewed and are negative.    Allergies  Sulfa antibiotics; Reglan; Zofran; Clindamycin/lincomycin; Morphine and related; and Penicillins  Home Medications   Current Outpatient Rx  Name  Route  Sig  Dispense  Refill  . acetaminophen (TYLENOL) 500 MG tablet   Oral   Take  500-1,000 mg by mouth every 6 (six) hours as needed for pain.          . benzonatate (TESSALON) 100 MG capsule   Oral   Take 100 mg by mouth 3 (three) times daily as needed for cough.         . chlorpheniramine-HYDROcodone (TUSSIONEX PENNKINETIC ER) 10-8 MG/5ML LQCR   Oral   Take 5 mLs by mouth every 12 (twelve) hours.   140 mL   0   . Multiple Vitamin (MULTIVITAMIN WITH MINERALS) TABS   Oral   Take 1 tablet by mouth daily.         Marland Kitchen oxybutynin (DITROPAN XL) 5 MG 24 hr tablet   Oral   Take 1 tablet (5 mg total) by mouth daily.   14 tablet   0   . QUEtiapine (SEROQUEL) 50 MG tablet   Oral   Take 50 mg by mouth 2 (two) times daily.          Marland Kitchen azithromycin (ZITHROMAX Z-PAK) 250 MG tablet      2 po day one, then 1 daily x 4 days   5 tablet   0    BP 104/56  Pulse 76  Temp(Src) 99 F (37.2 C) (Oral)  Resp  20  SpO2 97%  Vital signs normal    Physical Exam  Nursing note and vitals reviewed. Constitutional: She is oriented to person, place, and time. She appears well-developed and well-nourished.  Non-toxic appearance. She does not appear ill. No distress.  Looks like he feels bad.  HENT:  Head: Normocephalic and atraumatic.  Right Ear: External ear normal.  Left Ear: External ear normal.  Nose: Nose normal. No mucosal edema or rhinorrhea.  Mouth/Throat: Oropharynx is clear and moist and mucous membranes are normal. No dental abscesses or edematous.  Mildly dry mucus membranes  Eyes: Conjunctivae and EOM are normal. Pupils are equal, round, and reactive to light.  Neck: Normal range of motion and full passive range of motion without pain. Neck supple.  Cardiovascular: Normal rate, regular rhythm and normal heart sounds.  Exam reveals no gallop and no friction rub.   No murmur heard. Pulmonary/Chest: Effort normal and breath sounds normal. No respiratory distress. She has no wheezes. She has no rhonchi. She has no rales. She exhibits no tenderness and no  crepitus.  Coughing at times  Abdominal: Soft. Normal appearance and bowel sounds are normal. She exhibits no distension. There is no tenderness. There is no rebound and no guarding.  Musculoskeletal: Normal range of motion. She exhibits no edema and no tenderness.  Moves all extremities well.   Neurological: She is alert and oriented to person, place, and time. She has normal strength. No cranial nerve deficit.  Skin: Skin is warm, dry and intact. No rash noted. No erythema. No pallor.  Psychiatric: She has a normal mood and affect. Her speech is normal and behavior is normal. Her mood appears not anxious.    ED Course  Procedures (including critical care time)   Medications  levofloxacin (LEVAQUIN) IVPB 500 mg (500 mg Intravenous New Bag/Given 07/26/12 2039)  sodium chloride 0.9 % bolus 1,000 mL (1,000 mLs Intravenous New Bag/Given 07/26/12 2039)  dextromethorphan-guaiFENesin (MUCINEX DM) 30-600 MG per 12 hr tablet 1 tablet (1 tablet Oral Given 07/26/12 2100)  ketorolac (TORADOL) 30 MG/ML injection 30 mg (30 mg Intravenous Given 07/26/12 2058)  promethazine (PHENERGAN) injection 12.5 mg (12.5 mg Intravenous Given 07/26/12 2139)  potassium chloride SA (K-DUR,KLOR-CON) CR tablet 40 mEq (40 mEq Oral Given 07/26/12 2212)   Pt ambulated by staff and her pulse ox dropped to 87% on room air. Pt has failed outpatient treatment for her pneumonia.   22:25 Dr Conley Rolls, admit to med-surg, team 8  Results for orders placed during the hospital encounter of 07/26/12  CBC WITH DIFFERENTIAL      Result Value Range   WBC 6.1  4.0 - 10.5 K/uL   RBC 4.24  3.87 - 5.11 MIL/uL   Hemoglobin 13.0  12.0 - 15.0 g/dL   HCT 95.2  84.1 - 32.4 %   MCV 87.7  78.0 - 100.0 fL   MCH 30.7  26.0 - 34.0 pg   MCHC 34.9  30.0 - 36.0 g/dL   RDW 40.1  02.7 - 25.3 %   Platelets 282  150 - 400 K/uL   Neutrophils Relative % 59  43 - 77 %   Neutro Abs 3.6  1.7 - 7.7 K/uL   Lymphocytes Relative 34  12 - 46 %   Lymphs Abs 2.1  0.7 -  4.0 K/uL   Monocytes Relative 6  3 - 12 %   Monocytes Absolute 0.4  0.1 - 1.0 K/uL   Eosinophils Relative 0  0 - 5 %  Eosinophils Absolute 0.0  0.0 - 0.7 K/uL   Basophils Relative 0  0 - 1 %   Basophils Absolute 0.0  0.0 - 0.1 K/uL  COMPREHENSIVE METABOLIC PANEL      Result Value Range   Sodium 138  135 - 145 mEq/L   Potassium 3.0 (*) 3.5 - 5.1 mEq/L   Chloride 99  96 - 112 mEq/L   CO2 27  19 - 32 mEq/L   Glucose, Bld 89  70 - 99 mg/dL   BUN 6  6 - 23 mg/dL   Creatinine, Ser 1.19  0.50 - 1.10 mg/dL   Calcium 9.1  8.4 - 14.7 mg/dL   Total Protein 7.3  6.0 - 8.3 g/dL   Albumin 3.0 (*) 3.5 - 5.2 g/dL   AST 9  0 - 37 U/L   ALT 9  0 - 35 U/L   Alkaline Phosphatase 133 (*) 39 - 117 U/L   Total Bilirubin 0.2 (*) 0.3 - 1.2 mg/dL   GFR calc non Af Amer >90  >90 mL/min   GFR calc Af Amer >90  >90 mL/min    Laboratory interpretation all normal except hypokalemia  Dg Chest 2 View  07/26/2012   *RADIOLOGY REPORT*  Clinical Data: Fever, bronchitis, shortness of breath, chest pain  CHEST - 2 VIEW  Comparison: 07/17/2012  Findings: Increased streaky opacity within the retrocardiac left lower lobe and the lingula suspicious for a combination of atelectasis and/or developing pneumonia.  Right lung is clear.  No effusion or pneumothorax.  No edema.  Normal heart size vascularity.  Prior cholecystectomy noted.  IMPRESSION: Worsening lingula and left lower lobe atelectasis and/or pneumonia.   Original Report Authenticated By: Judie Petit. Miles Costain, M.D.    Ct Abdomen Pelvis Wo Contrast  07/13/2012  IMPRESSION:  1.  No acute findings in the abdomen or pelvis to explain the patient's clinical symptoms.  Specifically, no renal or ureteral stones.  2.  Surgical changes of prior cholecystectomy, appendectomy and hysterectomy.   Original Report Authenticated By: Malachy Moan, M.D.      Dg Chest 2 View  07/17/2012     IMPRESSION: Linear atelectasis or early infiltrate left base.  Linear atelectasis right base.   No pulmonary edema.   Original Report Authenticated By: Natasha Mead, M.D.      1. CAP (community acquired pneumonia)   2. Hypokalemia with normal acid-base balance   3. Dehydration   4. Nausea vomiting and diarrhea   5. Hypoxic     Plan admission  Devoria Albe, MD, FACEP    MDM patient failed outpatient treatment for her community-acquired pneumonia. She also now is having vomiting and diarrhea with hypokalemia. She is hypoxic on ambulation. She is being admitted for further treatment of her pneumonia.     Ward Givens, MD 07/26/12 2244

## 2012-07-26 NOTE — ED Notes (Signed)
Patient ambulated. 02 sat began at 96% and after walking around the nursing station twice desat to 87%.

## 2012-07-27 DIAGNOSIS — E876 Hypokalemia: Secondary | ICD-10-CM | POA: Diagnosis present

## 2012-07-27 LAB — BASIC METABOLIC PANEL
Calcium: 7.9 mg/dL — ABNORMAL LOW (ref 8.4–10.5)
GFR calc Af Amer: >90 mL/min (ref >90)
GFR calc non Af Amer: >90 mL/min (ref >90)
Sodium: 135 mEq/L (ref 135–145)

## 2012-07-27 MED ORDER — PNEUMOCOCCAL VAC POLYVALENT 25 MCG/0.5ML IJ INJ
0.5000 mL | INJECTION | Freq: Once | INTRAMUSCULAR | Status: DC
  Filled 2012-07-27 (×2): qty 0.5

## 2012-07-27 MED ORDER — PNEUMOCOCCAL VAC POLYVALENT 25 MCG/0.5ML IJ INJ
0.5000 mL | INJECTION | INTRAMUSCULAR | Status: AC
  Administered 2012-07-29: 0.5 mL via INTRAMUSCULAR
  Filled 2012-07-27 (×3): qty 0.5

## 2012-07-27 MED ORDER — HYDROCOD POLST-CHLORPHEN POLST 10-8 MG/5ML PO LQCR
5.0000 mL | Freq: Two times a day (BID) | ORAL | Status: DC
  Administered 2012-07-27 – 2012-07-29 (×5): 5 mL via ORAL
  Filled 2012-07-27 (×5): qty 5

## 2012-07-27 NOTE — Progress Notes (Addendum)
[facility] PROGRESS NOTE  [name] [name] YQM:[id number] DOB: 1965/10/10 DOA: 07/26/2012 PCP: No primary provider on file.  Brief narrative: 47 y.o. female with past medical history of substance abuse who presented to [facility] ED 07/26/2012 with persistent cough with green sputum production.Patient was given Z pak by PCP but she did not feel better.Evaluation in the ER showed CXR with worsening infiltrate on the lingular and left lung lobe. Patient was started on Lavquin for pneumonia.  Assessment/Plan:  Principal Problem:   CAP (community acquired pneumonia) - In the lingular region and left lung lobe. - Continue Levaquin IV daily - Continue Tussionex every 12 hours Active Problems:   Hx of substance abuse - On Dilaudid 1 mg every 3 hours IV as needed for severe pain   Hypokalemia - Repleted - BMP in the morning   Code Status: full code Family Communication: family at the bedside Disposition Plan: home in next 24 hours   Manson Passey, MD  [facility] Pager [phone]  If 7PM-7AM, please contact night-coverage [email or web address] Password [facility] 07/27/2012, 11:57 AM   LOS: 1 day   Consultants:  None   Procedures:  None   Antibiotics:  Levaquin 07/26/2012 -->  HPI/Subjective: No acute overnight events. Has non productive cough.  Objective: Filed Vitals:   07/26/12 1806 07/26/12 2000 07/26/12 2338 07/27/12 0525  BP: 109/66 104/56 109/83 103/66  Pulse: 91 76 68 63  Temp: 98.8 F (37.1 C) 99 F (37.2 C) 98.4 F (36.9 C) 98.2 F (36.8 C)  TempSrc: Oral Oral Oral Oral  Resp: 28 20 20 20   Height:   5\' 4"  (1.626 m)   Weight:   69.31 kg (152 lb 12.8 oz)   SpO2: 96% 97% 99% 97%    Intake/Output Summary (Last 24 hours) at 07/27/12 1157 Last data filed at 07/27/12 1132  Gross per 24 hour  Intake   1480 ml  Output      0 ml  Net   1480 ml    Exam:   General:  Pt is alert, follows commands appropriately, not in acute distress  Cardiovascular: Regular rate and rhythm,  S1/S2, no murmurs, no rubs, no gallops  Respiratory: Clear to auscultation bilaterally, no wheezing, no crackles, no rhonchi  Abdomen: Soft, non tender, non distended, bowel sounds present, no guarding  Extremities: No edema, pulses DP and PT palpable bilaterally  Neuro: Grossly nonfocal  Data Reviewed: Basic Metabolic Panel:  Recent Labs Lab 07/26/12 1850 07/27/12 0743  NA 138 135  K 3.0* 3.3*  CL 99 102  CO2 27 24  GLUCOSE 89 94  BUN 6 6  CREATININE 0.61 0.58  CALCIUM 9.1 7.9*   Liver Function Tests:  Recent Labs Lab 07/26/12 1850  AST 9  ALT 9  ALKPHOS 133*  BILITOT 0.2*  PROT 7.3  ALBUMIN 3.0*   No results found for this basename: LIPASE, AMYLASE,  in the last 168 hours No results found for this basename: AMMONIA,  in the last 168 hours CBC:  Recent Labs Lab 07/26/12 1850  WBC 6.1  NEUTROABS 3.6  HGB 13.0  HCT 37.2  MCV 87.7  PLT 282   Cardiac Enzymes: No results found for this basename: CKTOTAL, CKMB, CKMBINDEX, TROPONINI,  in the last 168 hours BNP: No components found with this basename: POCBNP,  CBG: No results found for this basename: GLUCAP,  in the last 168 hours  No results found for this or any previous visit (from the past 240 hour(s)).   Studies: Dg Chest  2 View 07/26/2012   * IMPRESSION: Worsening lingula and left lower lobe atelectasis and/or pneumonia.   Original Report Authenticated By: Judie Petit. Shick, M.D.    Scheduled Meds: . chlorpheniramine-HYDROcodone  5 mL Oral Q12H  . docusate sodium  100 mg Oral BID  . enoxaparin (LOVENOX) injection  40 mg Subcutaneous QHS  . levofloxacin (LEVAQUIN) IV  500 mg Intravenous Q24H  . oxybutynin  5 mg Oral Daily  . pneumococcal 23 valent vaccine  0.5 mL Intramuscular Once  . QUEtiapine  50 mg Oral BID   Continuous Infusions: . sodium chloride 125 mL/hr at 07/27/12 [phone]

## 2012-07-27 NOTE — Care Management Note (Signed)
CARE MANAGEMENT NOTE 07/27/2012  Patient:  [facility]   Account Number:  [id number]  Date Initiated:  07/27/2012  Documentation initiated by:  Deldrick Linch  Subjective/Objective Assessment:   47 yo female admitted with community acquired pneumonia. PTA pt independent.     Action/Plan:   Home when stable   Anticipated DC Date:     Anticipated DC Plan:  HOME/SELF CARE  In-house referral  Financial Counselor      DC Planning Services  PCP issues  CM consult      Choice offered to / List presented to:  NA   DME arranged  NA      DME agency  NA     HH arranged  NA      HH agency  NA   Status of service:  In process, will continue to follow Medicare Important Message given?   (If response is "NO", the following Medicare IM given date fields will be blank) Date Medicare IM given:   Date Additional Medicare IM given:    Discharge Disposition:    Per UR Regulation:  Reviewed for med. necessity/level of care/duration of stay  If discussed at Long Length of Stay Meetings, dates discussed:    Comments:  07/27/12 1040 Vanice Rappa,RN,BSN [phone] pt identified as self pay, No PCP. Cm to provide patient with community indigent providers and other community resources.

## 2012-07-27 NOTE — H&P (Signed)
[facility] History and Physical  [name] [name] BMW:[id number] DOB: Apr 01, 1965    PCP:   No primary provider on file.   Chief Complaint: persistent cough and back pain.  HPI: [name] [name] is an 47 y.o. female with hx of thyroid disease, substance abuse, prior kidney stones, presents to the ER with persistent coughs, lower back pain, green sputum with some blood, and general malaise for the past month.  She was given Z pak and codeine cough syrup.  She started to have nausea and vomiting after starting the cough medicine.  She continued to have coughs.  Evaluation in the ER showed CXR with worsening infiltrate on the lingular and left lobe.  Her WBC is normal without any leukocytosis.  Hospitalist was asked to admit her for failed outpatient tx of CAP.  Rewiew of Systems:  Constitutional: Negative for  chills. No significant weight loss or weight gain Eyes: Negative for eye pain, redness and discharge, diplopia, visual changes, or flashes of light. ENMT: Negative for ear pain, hoarseness, nasal congestion, sinus pressure and sore throat. No headaches; tinnitus, drooling, or problem swallowing. Cardiovascular: Negative for chest pain, palpitations, diaphoresis, dyspnea and peripheral edema. ; No orthopnea, PND Respiratory: Negative for cough, hemoptysis, wheezing and stridor. No pleuritic chestpain. Gastrointestinal: Negative for nausea, vomiting, diarrhea, constipation, abdominal pain, melena, blood in stool, hematemesis, jaundice and rectal bleeding.    Genitourinary: Negative for frequency, dysuria, incontinence,flank pain and hematuria; Musculoskeletal: Negative for neck pain. Negative for swelling and trauma.;  Skin: . Negative for pruritus, rash, abrasions, bruising and skin lesion.; ulcerations Neuro: Negative for headache, lightheadedness and neck stiffness. Negative for weakness, altered level of consciousness , altered mental status, extremity weakness, burning feet, involuntary  movement, seizure and syncope.  Psych: negative for anxiety, depression, insomnia, tearfulness, panic attacks, hallucinations, paranoia, suicidal or homicidal ideation    Past Medical History  Diagnosis Date  . Anxiety   . Thyroid disease   . Substance abuse   . Pneumonia   . Bronchitis   . Kidney stone   . Endometriosis     Past Surgical History  Procedure Laterality Date  . Abdominal hysterectomy    . Cholecystectomy    . Appendectomy    . Ex laporotomy      Medications:  HOME MEDS: Prior to Admission medications   Medication Sig Start Date End Date Taking? Authorizing Provider  acetaminophen (TYLENOL) 500 MG tablet Take 500-1,000 mg by mouth every 6 (six) hours as needed for pain.    Yes Historical Provider, MD  benzonatate (TESSALON) 100 MG capsule Take 100 mg by mouth 3 (three) times daily as needed for cough.   Yes Historical Provider, MD  chlorpheniramine-HYDROcodone (TUSSIONEX PENNKINETIC ER) 10-8 MG/5ML LQCR Take 5 mLs by mouth every 12 (twelve) hours. 07/17/12  Yes Flint Melter, MD  Multiple Vitamin (MULTIVITAMIN WITH MINERALS) TABS Take 1 tablet by mouth daily.   Yes Historical Provider, MD  oxybutynin (DITROPAN XL) 5 MG 24 hr tablet Take 1 tablet (5 mg total) by mouth daily. 07/14/12  Yes Teressa Lower, NP  QUEtiapine (SEROQUEL) 50 MG tablet Take 50 mg by mouth 2 (two) times daily.    Yes Historical Provider, MD  azithromycin (ZITHROMAX Z-PAK) 250 MG tablet 2 po day one, then 1 daily x 4 days 07/17/12   Flint Melter, MD     Allergies:  Allergies  Allergen Reactions  . Sulfa Antibiotics Anaphylaxis  . Reglan (Metoclopramide) Nausea And Vomiting  . Zofran (Ondansetron Hcl) Nausea  And Vomiting  . Clindamycin/Lincomycin Rash  . Morphine And Related Rash    Can have with benadryl  . Penicillins Rash    Social History:   reports that she has been smoking.  She has never used smokeless tobacco. She reports that  drinks alcohol. She reports that she uses  illicit drugs (Marijuana).  Family History: Family History  Problem Relation Age of Onset  . Thyroid disease Mother   . Thyroid disease Sister      Physical Exam: Filed Vitals:   07/26/12 1806 07/26/12 2000 07/26/12 2338  BP: 109/66 104/56 109/83  Pulse: 91 76 68  Temp: 98.8 F (37.1 C) 99 F (37.2 C) 98.4 F (36.9 C)  TempSrc: Oral Oral Oral  Resp: 28 20 20   Height:   5\' 4"  (1.626 m)  Weight:   69.31 kg (152 lb 12.8 oz)  SpO2: 96% 97% 99%   Blood pressure 109/83, pulse 68, temperature 98.4 F (36.9 C), temperature source Oral, resp. rate 20, height 5\' 4"  (1.626 m), weight 69.31 kg (152 lb 12.8 oz), SpO2 99.00%.  GEN:  Pleasant patient lying in the stretcher in no acute distress; cooperative with exam. PSYCH:  alert and oriented x4; does not appear anxious or depressed; affect is appropriate. HEENT: Mucous membranes pink and anicteric; PERRLA; EOM intact; no cervical lymphadenopathy nor thyromegaly or carotid bruit; no JVD; There were no stridor. Neck is very supple. Breasts:: Not examined CHEST WALL: No tenderness CHEST: Normal respiration, rhonchi on the left lung feel, without any crackles. HEART: Regular rate and rhythm.  There are no murmur, rub, or gallops.   BACK: No kyphosis or scoliosis; no CVA tenderness ABDOMEN: soft and non-tender; no masses, no organomegaly, normal abdominal bowel sounds; no pannus; no intertriginous candida. There is no rebound and no distention. Rectal Exam: Not done EXTREMITIES: No bone or joint deformity; age-appropriate arthropathy of the hands and knees; no edema; no ulcerations.  There is no calf tenderness. Genitalia: not examined PULSES: 2+ and symmetric SKIN: Normal hydration no rash or ulceration CNS: Cranial nerves 2-12 grossly intact no focal lateralizing neurologic deficit.  Speech is fluent; uvula elevated with phonation, facial symmetry and tongue midline. DTR are normal bilaterally, cerebella exam is intact, barbinski is  negative and strengths are equaled bilaterally.  No sensory loss.   Labs on Admission:  Basic Metabolic Panel:  Recent Labs Lab 07/26/12 1850  NA 138  K 3.0*  CL 99  CO2 27  GLUCOSE 89  BUN 6  CREATININE 0.61  CALCIUM 9.1   Liver Function Tests:  Recent Labs Lab 07/26/12 1850  AST 9  ALT 9  ALKPHOS 133*  BILITOT 0.2*  PROT 7.3  ALBUMIN 3.0*   No results found for this basename: LIPASE, AMYLASE,  in the last 168 hours No results found for this basename: AMMONIA,  in the last 168 hours CBC:  Recent Labs Lab 07/26/12 1850  WBC 6.1  NEUTROABS 3.6  HGB 13.0  HCT 37.2  MCV 87.7  PLT 282   Cardiac Enzymes: No results found for this basename: CKTOTAL, CKMB, CKMBINDEX, TROPONINI,  in the last 168 hours  CBG: No results found for this basename: GLUCAP,  in the last 168 hours   Radiological Exams on Admission: Dg Chest 2 View  07/26/2012   *RADIOLOGY REPORT*  Clinical Data: Fever, bronchitis, shortness of breath, chest pain  CHEST - 2 VIEW  Comparison: 07/17/2012  Findings: Increased streaky opacity within the retrocardiac left lower lobe and the  lingula suspicious for a combination of atelectasis and/or developing pneumonia.  Right lung is clear.  No effusion or pneumothorax.  No edema.  Normal heart size vascularity.  Prior cholecystectomy noted.  IMPRESSION: Worsening lingula and left lower lobe atelectasis and/or pneumonia.   Original Report Authenticated By: Judie Petit. Miles Costain, M.D.    Assessment/Plan Present on Admission:  . CAP (community acquired pneumonia) . Low back pain . Hx of substance abuse . Nephrolithiasis Hypokalemia.  PLAN:  Will admit her for CAP.  She was given IV Levaquin and I will continue with her meds.  She does exhibit some narcotic seeking behavior.  Will give her some dilaudid for her low back pain, which could be from incessant coughs.  I wonder if her nausea and vomiting is because of her codeine insensitive vs frequent coughs.  Her hypokalemia  will be supplemented.  She is stable, full code, and will be admitted to [facility].  I will give her PRN neb Tx also.  Other plans as per orders.  Code Status: FULL CODE>   Houston Siren, MD. [facility] Pager [phone] 7pm to 7am.  07/26/12

## 2012-07-28 MED ORDER — LORAZEPAM 2 MG/ML IJ SOLN
0.5000 mg | Freq: Once | INTRAMUSCULAR | Status: AC
  Administered 2012-07-28: 0.5 mg via INTRAVENOUS
  Filled 2012-07-28: qty 1

## 2012-07-28 MED ORDER — NICOTINE POLACRILEX 2 MG MT GUM
2.0000 mg | CHEWING_GUM | OROMUCOSAL | Status: DC | PRN
  Administered 2012-07-28 – 2012-07-29 (×2): 2 mg via ORAL
  Filled 2012-07-28 (×5): qty 1

## 2012-07-28 MED ORDER — PROMETHAZINE HCL 25 MG/ML IJ SOLN
25.0000 mg | Freq: Four times a day (QID) | INTRAMUSCULAR | Status: DC | PRN
  Administered 2012-07-28 – 2012-07-29 (×4): 25 mg via INTRAVENOUS
  Filled 2012-07-28 (×5): qty 1

## 2012-07-28 MED ORDER — PROCHLORPERAZINE EDISYLATE 5 MG/ML IJ SOLN
5.0000 mg | Freq: Four times a day (QID) | INTRAMUSCULAR | Status: DC | PRN
  Filled 2012-07-28: qty 2

## 2012-07-28 MED ORDER — ADULT MULTIVITAMIN LIQUID CH
5.0000 mL | Freq: Every day | ORAL | Status: DC
  Administered 2012-07-29: 5 mL via ORAL
  Filled 2012-07-28 (×2): qty 5

## 2012-07-28 MED ORDER — PROCHLORPERAZINE EDISYLATE 5 MG/ML IJ SOLN: 10.0000 mg | Freq: Four times a day (QID) | INTRAMUSCULAR | Status: DC | PRN

## 2012-07-28 NOTE — Progress Notes (Signed)
INITIAL NUTRITION ASSESSMENT  DOCUMENTATION CODES Per approved criteria  -Not Applicable   INTERVENTION: Provide Liquid Multivitamin daily Diet advancement per MD discretion Encourage PO intake as toleated  NUTRITION DIAGNOSIS: Inadequate oral intake related to current illness as evidenced by pt with CAP and poor po intake for past month.   Goal: Pt to meet >/= 90% of their estimated nutrition needs  Monitor:  PO intake Weight Labs  Reason for Assessment: Malnutrition Screening Tool, score of 2  47 y.o. female  Admitting Dx: CAP (community acquired pneumonia)  ASSESSMENT: 47 y.o. female with hx of thyroid disease, substance abuse, prior kidney stones, presents to the ER with persistent coughs, lower back pain, green sputum with some blood, and general malaise for the past month. She was given Z pak and codeine cough syrup. She started to have nausea and vomiting after starting the cough medicine. She continued to have coughs. Evaluation in the ER showed CXR with worsening infiltrate on the lingular and left lobe. Her WBC is normal without any leukocytosis. Hospitalist was asked to admit her for failed outpatient tx of CAP. Pt states that for the past month she has been eating much less than usual; pt has been primarily drinking water and soda and eating some Macaroni and cheese and some saltine crackers. Pt denies weight loss stating she usually weighs 145 lbs. Pt reports that she had a tray of clear liquids last night and felt fine afterward but, overnight she had a bad headache, woke up this morning nauseous and vomited multiple times.   Height: Ht Readings from Last 1 Encounters:  07/26/12 5\' 4"  (1.626 m)    Weight: Wt Readings from Last 1 Encounters:  07/26/12 152 lb 12.8 oz (69.31 kg)    Ideal Body Weight: 120 lbs  % Ideal Body Weight: 127%  Wt Readings from Last 10 Encounters:  07/26/12 152 lb 12.8 oz (69.31 kg)  07/17/12 154 lb 4 oz (69.967 kg)  04/10/12 143 lb  (64.864 kg)  03/30/12 145 lb (65.772 kg)    Usual Body Weight: 145 lb per pt report  % Usual Body Weight: 105%  BMI:  Body mass index is 26.22 kg/(m^2).  Estimated Nutritional Needs: Kcal: 1800-2000 Protein: 70-83 grams Fluid: 2.2 L  Skin: WDL  Diet Order: Full Liquid  EDUCATION NEEDS: -No education needs identified at this time   Intake/Output Summary (Last 24 hours) at 07/28/12 1211 Last data filed at 07/28/12 0452  Gross per 24 hour  Intake 3637.83 ml  Output      0 ml  Net 3637.83 ml    Last BM: 6/25  Labs:   Recent Labs Lab 07/26/12 1850 07/27/12 0743  NA 138 135  K 3.0* 3.3*  CL 99 102  CO2 27 24  BUN 6 6  CREATININE 0.61 0.58  CALCIUM 9.1 7.9*  GLUCOSE 89 94    CBG (last 3)  No results found for this basename: GLUCAP,  in the last 72 hours  Scheduled Meds: . chlorpheniramine-HYDROcodone  5 mL Oral Q12H  . docusate sodium  100 mg Oral BID  . enoxaparin (LOVENOX) injection  40 mg Subcutaneous QHS  . levofloxacin (LEVAQUIN) IV  500 mg Intravenous Q24H  . oxybutynin  5 mg Oral Daily  . pneumococcal 23 valent vaccine  0.5 mL Intramuscular Tomorrow-1000  . QUEtiapine  50 mg Oral BID    Continuous Infusions: . sodium chloride 125 mL/hr at 07/27/12 2354    Past Medical History  Diagnosis Date  .  Anxiety   . Thyroid disease   . Substance abuse   . Pneumonia   . Bronchitis   . Kidney stone   . Endometriosis     Past Surgical History  Procedure Laterality Date  . Abdominal hysterectomy    . Cholecystectomy    . Appendectomy    . Ex laporotomy      Ian Malkin RD, LDN Inpatient Clinical Dietitian Pager: [phone] After Hours Pager: [phone]

## 2012-07-28 NOTE — Progress Notes (Signed)
[facility] PROGRESS NOTE  [name] [name] ZOX:[id number] DOB: December 09, 1965 DOA: 07/26/2012 PCP: No primary provider on file.  Brief narrative: 47 y.o. female with past medical history of substance abuse who presented to [facility] ED 07/26/2012 with persistent cough with green sputum production.Patient was given Z pak by PCP but she did not feel better.Evaluation in the ER showed CXR with worsening infiltrate on the lingular and left lung lobe. Patient was started on Lavquin for pneumonia.   Assessment/Plan:   Principal Problem:  CAP (community acquired pneumonia)  - In the lingular region and left lung lobe.  - Continue Levaquin IV daily  - Continue Tussionex every 12 hours as tolerated; patient did have nausea and vomiting this am Active Problems:  Hx of substance abuse  - On Dilaudid 1 mg every 3 hours IV as needed for severe pain; will continue this regimen Hypokalemia  - Repleted  - follow up BMP in am  Code Status: full code  Family Communication: family not at the bedside  Disposition Plan: home in next 24 hours   Manson Passey, MD  [facility]  Pager [phone]   Consultants:  None  Procedures:  None  Antibiotics:  Levaquin 07/26/2012 -->  If 7PM-7AM, please contact night-coverage [email or web address] Password [facility] 07/28/2012, 12:45 PM   LOS: 2 days    HPI/Subjective: Had few episodes on nausea and vomiting.  Objective: Filed Vitals:   07/27/12 0525 07/27/12 1344 07/27/12 2029 07/28/12 0446  BP: 103/66 111/72 119/73 118/75  Pulse: 63 68 98 73  Temp: 98.2 F (36.8 C) 97.9 F (36.6 C) 97.7 F (36.5 C) 98.5 F (36.9 C)  TempSrc: Oral Oral Oral Oral  Resp: 20 20 20 20   Height:      Weight:      SpO2: 97% 98% 97% 95%    Intake/Output Summary (Last 24 hours) at 07/28/12 1245 Last data filed at 07/28/12 0452  Gross per 24 hour  Intake 3637.83 ml  Output      0 ml  Net 3637.83 ml    Exam:   General:  Pt is alert, follows commands appropriately, not in acute  distress  Cardiovascular: Regular rate and rhythm, S1/S2, no murmurs, no rubs, no gallops  Respiratory: Clear to auscultation bilaterally, no wheezing, no crackles, no rhonchi  Abdomen: Soft, non tender, non distended, bowel sounds present, no guarding  Extremities: No edema, pulses DP and PT palpable bilaterally  Neuro: Grossly nonfocal  Data Reviewed: Basic Metabolic Panel:  Recent Labs Lab 07/26/12 1850 07/27/12 0743  NA 138 135  K 3.0* 3.3*  CL 99 102  CO2 27 24  GLUCOSE 89 94  BUN 6 6  CREATININE 0.61 0.58  CALCIUM 9.1 7.9*   Liver Function Tests:  Recent Labs Lab 07/26/12 1850  AST 9  ALT 9  ALKPHOS 133*  BILITOT 0.2*  PROT 7.3  ALBUMIN 3.0*   No results found for this basename: LIPASE, AMYLASE,  in the last 168 hours No results found for this basename: AMMONIA,  in the last 168 hours CBC:  Recent Labs Lab 07/26/12 1850  WBC 6.1  NEUTROABS 3.6  HGB 13.0  HCT 37.2  MCV 87.7  PLT 282   Cardiac Enzymes: No results found for this basename: CKTOTAL, CKMB, CKMBINDEX, TROPONINI,  in the last 168 hours BNP: No components found with this basename: POCBNP,  CBG: No results found for this basename: GLUCAP,  in the last 168 hours  No results found for this or any previous visit (from  the past 240 hour(s)).   Studies: Dg Chest 2 View 07/26/2012   * IMPRESSION: Worsening lingula and left lower lobe atelectasis and/or pneumonia.   Original Report Authenticated By: Judie Petit. Shick, M.D.    Scheduled Meds: . docusate sodium  100 mg Oral BID  . enoxaparin (LOVENOX)   40 mg Subcutaneous QHS  . levofloxacin (LEVAQUIN)   500 mg Intravenous Q24H  . multivitamin  5 mL Oral Daily  . oxybutynin  5 mg Oral Daily  . QUEtiapine  50 mg Oral BID   Continuous Infusions: . sodium chloride 125 mL/hr at 07/27/12 2354

## 2012-07-28 NOTE — Care Management Note (Signed)
Follow up appointment made per pt permission with [facility] with Dr.Devine 08/03/12 at 11:15 pm.     [name] [name] [phone]

## 2012-07-29 LAB — CBC
Hemoglobin: 11.3 g/dL — ABNORMAL LOW (ref 12.0–15.0)
MCH: 30.1 pg (ref 26.0–34.0)
MCV: 87 fL (ref 78.0–100.0)
RBC: 3.76 MIL/uL — ABNORMAL LOW (ref 3.87–5.11)

## 2012-07-29 LAB — BASIC METABOLIC PANEL
CO2: 27 mEq/L (ref 19–32)
Chloride: 102 mEq/L (ref 96–112)
Glucose, Bld: 95 mg/dL (ref 70–99)
Potassium: 3.2 mEq/L — ABNORMAL LOW (ref 3.5–5.1)
Sodium: 138 mEq/L (ref 135–145)

## 2012-07-29 MED ORDER — HYDROCOD POLST-CHLORPHEN POLST 10-8 MG/5ML PO LQCR: 5.0000 mL | Freq: Two times a day (BID) | ORAL | Status: DC

## 2012-07-29 MED ORDER — LORAZEPAM 1 MG PO TABS: 1.0000 mg | ORAL_TABLET | Freq: Two times a day (BID) | ORAL | Status: DC | PRN

## 2012-07-29 MED ORDER — LEVOFLOXACIN 500 MG PO TABS: 500.0000 mg | ORAL_TABLET | Freq: Every day | ORAL | Status: DC

## 2012-07-29 MED ORDER — PROMETHAZINE HCL 50 MG PO TABS: 50.0000 mg | ORAL_TABLET | Freq: Four times a day (QID) | ORAL | Status: DC | PRN

## 2012-07-29 MED ORDER — POTASSIUM CHLORIDE CRYS ER 20 MEQ PO TBCR
40.0000 meq | EXTENDED_RELEASE_TABLET | Freq: Once | ORAL | Status: AC
  Administered 2012-07-29: 40 meq via ORAL
  Filled 2012-07-29: qty 2

## 2012-07-29 MED ORDER — OXYCODONE-ACETAMINOPHEN 10-325 MG PO TABS: 1.0000 | ORAL_TABLET | ORAL | Status: DC | PRN

## 2012-07-29 NOTE — Progress Notes (Signed)
Pt discharged to home at 1330, states feeling much improved and appreciative of excellent care. Follow=up appointment at [facility] scheduled. Plans to seek help with anxiety issues.

## 2012-07-29 NOTE — Discharge Summary (Signed)
Physician Discharge Summary  Ennis Delpozo OZH:[id number] DOB: 1965/08/27 DOA: 07/26/2012  PCP: No primary provider on file.  Admit date: 07/26/2012 Discharge date: 07/29/2012  Recommendations for Outpatient Follow-up:  1. Follow up with PCP in 1-2 weeks post discharge or sooner if symptoms recur 2. Continue Levaquin for 10 days on discharge 500 mg daily 3. Potassium repleted prior to discharge today; please make sure you follow up with your PCP and recheck BMP on the next visit  Discharge Diagnoses:  Principal Problem:   CAP (community acquired pneumonia) Active Problems:   Hx of substance abuse   Hypokalemia   Discharge Condition: medically stable for discharge home today  Diet recommendation: as tolerated  History of present illness:  47 y.o. female with past medical history of substance abuse who presented to [facility] ED 07/26/2012 with persistent cough with green sputum production.Patient was given Z pak by PCP but she did not feel better.Evaluation in the ER showed CXR with worsening infiltrate on the lingular and left lung lobe. Patient was started on Lavquin for pneumonia.   Assessment/Plan:   Principal Problem:  CAP (community acquired pneumonia)  - In the lingular region and left lung lobe.  - Continue Levaquin daily for 10 more days on discharge - Continue Tussionex every 12 hours as tolerated; prescription provided Active Problems:  Hx of substance abuse  - On Dilaudid 1 mg every 3 hours IV as needed for severe pain in the hospital; prescribed dilaudid 2 mg Q 4-6 hours PRN PO for home Hypokalemia  - Repleted prior to discharge  Code Status: full code  Family Communication: husband at the bedside  Disposition Plan: home today  Manson Passey, MD  [facility]  Pager [phone]  Consultants:  None  Procedures:  None  Antibiotics:  Levaquin 07/26/2012 --> for 10 days on discharge   Discharge Exam: Filed Vitals:   07/29/12 0543  BP: 120/79  Pulse: 70  Temp: 98.5 F (36.9  C)  Resp: 20   Filed Vitals:   07/28/12 0446 07/28/12 1441 07/28/12 2041 07/29/12 0543  BP: 118/75 132/81 127/81 120/79  Pulse: 73 76 77 70  Temp: 98.5 F (36.9 C) 98.8 F (37.1 C) 99.3 F (37.4 C) 98.5 F (36.9 C)  TempSrc: Oral Oral Oral Oral  Resp: 20 20 20 20   Height:      Weight:      SpO2: 95% 96% 95% 96%    General: Pt is alert, follows commands appropriately, not in acute distress Cardiovascular: Regular rate and rhythm, S1/S2 +, no murmurs, no rubs, no gallops Respiratory: Clear to auscultation bilaterally, no wheezing, no crackles, no rhonchi Abdominal: Soft, non tender, non distended, bowel sounds +, no guarding Extremities: no edema, no cyanosis, pulses palpable bilaterally DP and PT Neuro: Grossly nonfocal  Discharge Instructions  Discharge Orders   Future Appointments Provider Department Dept Phone   08/03/2012 11:15 AM [facility] Covering Provider [facility] Flores [phone]   Future Orders Complete By Expires     Call MD for:  difficulty breathing, headache or visual disturbances  As directed     Call MD for:  persistant dizziness or light-headedness  As directed     Call MD for:  persistant nausea and vomiting  As directed     Call MD for:  severe uncontrolled pain  As directed     Diet - low sodium heart healthy  As directed     Discharge instructions  As directed     Comments:  Follow up with PCP in 1-2 weeks post discharge or sooner if symptoms recur.    Increase activity slowly  As directed         Medication List    STOP taking these medications       benzonatate 100 MG capsule  Commonly known as:  TESSALON      TAKE these medications       acetaminophen 500 MG tablet  Commonly known as:  TYLENOL  Take 500-1,000 mg by mouth every 6 (six) hours as needed for pain.     azithromycin 250 MG tablet  Commonly known as:  ZITHROMAX Z-PAK  2 po day one, then 1 daily x 4 days     chlorpheniramine-HYDROcodone 10-8  MG/5ML Lqcr  Commonly known as:  TUSSIONEX PENNKINETIC ER  Take 5 mLs by mouth every 12 (twelve) hours.     levofloxacin 500 MG tablet  Commonly known as:  LEVAQUIN  Take 1 tablet (500 mg total) by mouth daily.     LORazepam 1 MG tablet  Commonly known as:  ATIVAN  Take 1 tablet (1 mg total) by mouth every 12 (twelve) hours as needed for anxiety.     multivitamin with minerals Tabs  Take 1 tablet by mouth daily.     oxybutynin 5 MG 24 hr tablet  Commonly known as:  DITROPAN XL  Take 1 tablet (5 mg total) by mouth daily.     oxyCODONE-acetaminophen 10-325 MG per tablet  Commonly known as:  PERCOCET  Take 1 tablet by mouth every 4 (four) hours as needed for pain.     promethazine 50 MG tablet  Commonly known as:  PHENERGAN  Take 1 tablet (50 mg total) by mouth every 6 (six) hours as needed for nausea.     QUEtiapine 50 MG tablet  Commonly known as:  SEROQUEL  Take 50 mg by mouth 2 (two) times daily.           Follow-up Information   Follow up with [facility]     On 08/03/2012. (11:15 with Dr.Devine)    Contact information:   [address] [place] [address]        The results of significant diagnostics from this hospitalization (including imaging, microbiology, ancillary and laboratory) are listed below for reference.    Significant Diagnostic Studies: Ct Abdomen Pelvis Wo Contrast  07/13/2012   *RADIOLOGY REPORT*  Clinical Data: Right flank pain  CT ABDOMEN AND PELVIS WITHOUT CONTRAST  Technique:  Multidetector CT imaging of the abdomen and pelvis was performed following the standard protocol without intravenous contrast.  Comparison: Prior CT urogram 04/19/2008  Findings:  Lower Chest:  Lung bases are clear.  Visualized cardiac structures within normal limits for size.  No pericardial effusion. Unremarkable distal thoracic esophagus.  Abdomen: Unenhanced CT was performed per clinician order.  Lack of IV contrast limits  sensitivity and specificity, especially for evaluation of abdominal/pelvic solid viscera.  Within these limitations, unremarkable CT appearance of the stomach, duodenum, spleen, adrenal glands and pancreas.  No focal hepatic lesion. Trace geographic hypoattenuation in the medial segment the left hepatic lobe adjacent to the fissure for the falciform ligament likely reflects focal fatty infiltration.  Status post cholecystectomy.  Unremarkable appearance of the kidneys bilaterally.  No hydronephrosis or nephrolithiasis.  No focal renal contour abnormality.  Normal-caliber large and small bowel throughout the abdomen.  No evidence of obstruction.  No significant diverticular disease. Surgical clips at the base  of the cecum suggest prior appendectomy. Mild fecalization of the terminal ileum.  No free fluid or suspicious adenopathy.  Pelvis: The bladder is distant with urine.  Surgical changes of prior hysterectomy.  No free fluid or suspicious adenopathy.  Bones: No acute fracture or aggressive appearing lytic or blastic osseous lesion.  Subacute or remote fracture of the right eleventh rib.  Vascular: No significant atherosclerotic vascular disease or other acute abnormality.  IMPRESSION:  1.  No acute findings in the abdomen or pelvis to explain the patient's clinical symptoms.  Specifically, no renal or ureteral stones.  2.  Surgical changes of prior cholecystectomy, appendectomy and hysterectomy.   Original Report Authenticated By: Malachy Moan, M.D.   Dg Chest 2 View  07/26/2012   *RADIOLOGY REPORT*  Clinical Data: Fever, bronchitis, shortness of breath, chest pain  CHEST - 2 VIEW  Comparison: 07/17/2012  Findings: Increased streaky opacity within the retrocardiac left lower lobe and the lingula suspicious for a combination of atelectasis and/or developing pneumonia.  Right lung is clear.  No effusion or pneumothorax.  No edema.  Normal heart size vascularity.  Prior cholecystectomy noted.  IMPRESSION:  Worsening lingula and left lower lobe atelectasis and/or pneumonia.   Original Report Authenticated By: Judie Petit. Miles Costain, M.D.   Dg Chest 2 View  07/17/2012   *RADIOLOGY REPORT*  Clinical Data: Fever, cough  CHEST - 2 VIEW  Comparison: 04/10/2012  Findings: Cardiomediastinal silhouette is unremarkable.  No pulmonary edema.  There is linear atelectasis or early infiltrate left base.  Linear atelectasis noted right base.  IMPRESSION: Linear atelectasis or early infiltrate left base.  Linear atelectasis right base.  No pulmonary edema.   Original Report Authenticated By: Natasha Mead, M.D.    Microbiology: No results found for this or any previous visit (from the past 240 hour(s)).   Labs: Basic Metabolic Panel:  Recent Labs Lab 07/26/12 1850 07/27/12 0743 07/29/12 0420  NA 138 135 138  K 3.0* 3.3* 3.2*  CL 99 102 102  CO2 27 24 27   GLUCOSE 89 94 95  BUN 6 6 <3*  CREATININE 0.61 0.58 0.57  CALCIUM 9.1 7.9* 8.8   Liver Function Tests:  Recent Labs Lab 07/26/12 1850  AST 9  ALT 9  ALKPHOS 133*  BILITOT 0.2*  PROT 7.3  ALBUMIN 3.0*   No results found for this basename: LIPASE, AMYLASE,  in the last 168 hours No results found for this basename: AMMONIA,  in the last 168 hours CBC:  Recent Labs Lab 07/26/12 1850 07/29/12 0420  WBC 6.1 6.5  NEUTROABS 3.6  --   HGB 13.0 11.3*  HCT 37.2 32.7*  MCV 87.7 87.0  PLT 282 296   Cardiac Enzymes: No results found for this basename: CKTOTAL, CKMB, CKMBINDEX, TROPONINI,  in the last 168 hours BNP: BNP (last 3 results) No results found for this basename: PROBNP,  in the last 8760 hours CBG: No results found for this basename: GLUCAP,  in the last 168 hours  Time coordinating discharge: Over 30 minutes  Signed:  Manson Passey, MD  [facility]  07/29/2012, 9:53 AM  Pager #: [phone]

## 2012-08-03 ENCOUNTER — Ambulatory Visit: Payer: Self-pay

## 2012-08-05 ENCOUNTER — Emergency Department (HOSPITAL_COMMUNITY)
Admission: EM | Admit: 2012-08-05 | Discharge: 2012-08-06 | Disposition: A | Discharge status: Home / Self Care | Payer: Self-pay | Attending: Emergency Medicine | Admitting: Emergency Medicine

## 2012-08-05 ENCOUNTER — Emergency Department (HOSPITAL_COMMUNITY): Payer: Self-pay

## 2012-08-05 ENCOUNTER — Encounter (HOSPITAL_COMMUNITY): Payer: Self-pay | Admitting: Emergency Medicine

## 2012-08-05 DIAGNOSIS — Z8709 Personal history of other diseases of the respiratory system: Secondary | ICD-10-CM | POA: Insufficient documentation

## 2012-08-05 DIAGNOSIS — Z87442 Personal history of urinary calculi: Secondary | ICD-10-CM | POA: Insufficient documentation

## 2012-08-05 DIAGNOSIS — R091 Pleurisy: Secondary | ICD-10-CM | POA: Insufficient documentation

## 2012-08-05 DIAGNOSIS — R0602 Shortness of breath: Secondary | ICD-10-CM | POA: Insufficient documentation

## 2012-08-05 DIAGNOSIS — Z862 Personal history of diseases of the blood and blood-forming organs and certain disorders involving the immune mechanism: Secondary | ICD-10-CM | POA: Insufficient documentation

## 2012-08-05 DIAGNOSIS — Z8639 Personal history of other endocrine, nutritional and metabolic disease: Secondary | ICD-10-CM | POA: Insufficient documentation

## 2012-08-05 DIAGNOSIS — Z880 Allergy status to penicillin: Secondary | ICD-10-CM | POA: Insufficient documentation

## 2012-08-05 DIAGNOSIS — Z8742 Personal history of other diseases of the female genital tract: Secondary | ICD-10-CM | POA: Insufficient documentation

## 2012-08-05 DIAGNOSIS — Z8701 Personal history of pneumonia (recurrent): Secondary | ICD-10-CM | POA: Insufficient documentation

## 2012-08-05 DIAGNOSIS — F17200 Nicotine dependence, unspecified, uncomplicated: Secondary | ICD-10-CM | POA: Insufficient documentation

## 2012-08-05 DIAGNOSIS — R071 Chest pain on breathing: Secondary | ICD-10-CM | POA: Insufficient documentation

## 2012-08-05 DIAGNOSIS — F411 Generalized anxiety disorder: Secondary | ICD-10-CM | POA: Insufficient documentation

## 2012-08-05 DIAGNOSIS — R0789 Other chest pain: Secondary | ICD-10-CM

## 2012-08-05 DIAGNOSIS — Z79899 Other long term (current) drug therapy: Secondary | ICD-10-CM | POA: Insufficient documentation

## 2012-08-05 NOTE — ED Provider Notes (Addendum)
History    CSN: [id number] Arrival date & time 08/05/12  2302  First MD Initiated Contact with Patient 08/05/12 2308     Chief Complaint  Patient presents with  . Cough  . Shortness of Breath  . pain on right rib cage    (Consider location/radiation/quality/duration/timing/severity/associated sxs/prior Treatment) HPI Comments: Patient presents to the ER for evaluation of right-sided chest pain. Patient was recently treated for pneumonia. Patient was initially treated as an outpatient, then required admission for failure to respond. Patient was in the hospital for several days requiring high-dose opioid analgesia during her hospitalization and then went home on Dilaudid by mouth. He is now stating that the medication is no longer helping, having severe sharp pains in the right side of her chest wall when she coughs or breathes.  Patient is a 47 y.o. female presenting with cough and shortness of breath.  Cough Associated symptoms: chest pain and shortness of breath   Shortness of Breath Associated symptoms: chest pain and cough    Past Medical History  Diagnosis Date  . Anxiety   . Thyroid disease   . Substance abuse   . Pneumonia   . Bronchitis   . Kidney stone   . Endometriosis    Past Surgical History  Procedure Laterality Date  . Abdominal hysterectomy    . Cholecystectomy    . Appendectomy    . Ex laporotomy     Family History  Problem Relation Age of Onset  . Thyroid disease Mother   . Thyroid disease Sister    History  Substance Use Topics  . Smoking status: Current Every Day Smoker  . Smokeless tobacco: Never Used  . Alcohol Use: Yes     Comment: socially   OB History   Grav Para Term Preterm Abortions TAB SAB Ect Mult Living                 Review of Systems  Respiratory: Positive for cough and shortness of breath.   Cardiovascular: Positive for chest pain.  All other systems reviewed and are negative.    Allergies  Sulfa antibiotics; Reglan;  Zofran; Clindamycin/lincomycin; Morphine and related; and Penicillins  Home Medications   Current Outpatient Rx  Name  Route  Sig  Dispense  Refill  . acetaminophen (TYLENOL) 500 MG tablet   Oral   Take 500-1,000 mg by mouth every 6 (six) hours as needed for pain.          Marland Kitchen azithromycin (ZITHROMAX Z-PAK) 250 MG tablet      2 po day one, then 1 daily x 4 days   5 tablet   0   . chlorpheniramine-HYDROcodone (TUSSIONEX PENNKINETIC ER) 10-8 MG/5ML LQCR   Oral   Take 5 mLs by mouth every 12 (twelve) hours.   140 mL   0   . levofloxacin (LEVAQUIN) 500 MG tablet   Oral   Take 1 tablet (500 mg total) by mouth daily.   10 tablet   0   . LORazepam (ATIVAN) 1 MG tablet   Oral   Take 1 tablet (1 mg total) by mouth every 12 (twelve) hours as needed for anxiety.   60 tablet   0   . Multiple Vitamin (MULTIVITAMIN WITH MINERALS) TABS   Oral   Take 1 tablet by mouth daily.         Marland Kitchen oxybutynin (DITROPAN XL) 5 MG 24 hr tablet   Oral   Take 1 tablet (5 mg total) by  mouth daily.   14 tablet   0   . oxyCODONE-acetaminophen (PERCOCET) 10-325 MG per tablet   Oral   Take 1 tablet by mouth every 4 (four) hours as needed for pain.   60 tablet   0   . promethazine (PHENERGAN) 50 MG tablet   Oral   Take 1 tablet (50 mg total) by mouth every 6 (six) hours as needed for nausea.   30 tablet   0   . QUEtiapine (SEROQUEL) 50 MG tablet   Oral   Take 50 mg by mouth 2 (two) times daily.           BP 131/72  Pulse 92  Temp(Src) 98 F (36.7 C) (Oral)  Resp 11  Ht 5\' 4"  (1.626 m)  Wt 150 lb (68.04 kg)  BMI 25.73 kg/m2  SpO2 97% Physical Exam  Constitutional: She is oriented to person, place, and time. She appears well-developed and well-nourished. No distress.  HENT:  Head: Normocephalic and atraumatic.  Right Ear: Hearing normal.  Left Ear: Hearing normal.  Nose: Nose normal.  Mouth/Throat: Oropharynx is clear and moist and mucous membranes are normal.  Eyes:  Conjunctivae and EOM are normal. Pupils are equal, round, and reactive to light.  Neck: Normal range of motion. Neck supple.  Cardiovascular: Regular rhythm, S1 normal and S2 normal.  Exam reveals no gallop and no friction rub.   No murmur heard. Pulmonary/Chest: Effort normal and breath sounds normal. No respiratory distress. She exhibits no tenderness.    Abdominal: Soft. Normal appearance and bowel sounds are normal. There is no hepatosplenomegaly. There is no tenderness. There is no rebound, no guarding, no tenderness at McBurney's point and negative Murphy's sign. No hernia.  Musculoskeletal: Normal range of motion.  Neurological: She is alert and oriented to person, place, and time. She has normal strength. No cranial nerve deficit or sensory deficit. Coordination normal. GCS eye subscore is 4. GCS verbal subscore is 5. GCS motor subscore is 6.  Skin: Skin is warm, dry and intact. No rash noted. No cyanosis.  Psychiatric: She has a normal mood and affect. Her speech is normal and behavior is normal. Thought content normal.    ED Course  Procedures (including critical care time) Labs Reviewed - No data to display Dg Chest 2 View  08/05/2012   *RADIOLOGY REPORT*  Clinical Data: Right lower chest pain, shortness of breath, currently on treatment for pneumonia, history smoking  CHEST - 2 VIEW  Comparison: None.  Findings: Normal heart size, mediastinal contours, and pulmonary vascularity. Minimal atelectasis at lung bases. Lungs otherwise clear. Resolution of previously identified left basilar consolidation. No new areas of infiltrate, pleural effusion, or pneumothorax. Bones unremarkable.  IMPRESSION: Minimal atelectasis at lung bases. Improved left basilar aeration.   Original Report Authenticated By: Ulyses Southward, M.D.   Diagnosis: Chest wall pain, possible pleurisy  MDM  Presents to the ER for evaluation of pain in the right rib cage area. Patient reports that the pain worsens with cough,  breathing. There is also reproducibility with palpation and movement. This is consistent with chest wall pain. Patient was recently admitted and treated for pneumonia. Chest x-ray today shows resolution of the pneumonia. Patient has a known history of substance abuse. She was given significant amounts of narcotic analgesia during her hospitalization and at time of discharge. Will be treated here in the ER, but no further cardiac analgesia to be provided at home. We'll treat empirically for pleurisy and possible bronchospasm. Restart prednisone  taper, add albuterol.  Addendum: At time of discharge, patient asking for prescription for Dilaudid. I explained to her that her condition not require narcotic analgesia, and with her history of drug abuse, I would not be prescribing any further narcotics. She became quite demanding at times angry and belligerent, but ultimately was discharged with the prednisone and albuterol.  Gilda Crease, MD 08/05/12 1610  Gilda Crease, MD 08/06/12 [phone]

## 2012-08-05 NOTE — ED Notes (Signed)
From home with complaint of pain on her right rib cage upon coughing at 6/10. Pt. Claimed of having pneumonia a week ago and was on antibiotic. Also reported of  SOB .

## 2012-08-06 MED ORDER — PREDNISONE 20 MG PO TABS: ORAL_TABLET | ORAL | Status: DC

## 2012-08-06 MED ORDER — HYDROMORPHONE HCL PF 2 MG/ML IJ SOLN
2.0000 mg | Freq: Once | INTRAMUSCULAR | Status: AC
  Administered 2012-08-06: 2 mg via INTRAMUSCULAR
  Filled 2012-08-06: qty 1

## 2012-08-06 MED ORDER — ALBUTEROL SULFATE HFA 108 (90 BASE) MCG/ACT IN AERS: 2.0000 | INHALATION_SPRAY | RESPIRATORY_TRACT | Status: DC | PRN

## 2012-08-06 MED ORDER — ONDANSETRON 8 MG PO TBDP
8.0000 mg | ORAL_TABLET | Freq: Once | ORAL | Status: AC
  Administered 2012-08-06: 8 mg via ORAL
  Filled 2012-08-06: qty 1

## 2012-08-08 ENCOUNTER — Ambulatory Visit: Payer: Self-pay | Attending: Family Medicine | Admitting: Internal Medicine

## 2012-08-08 ENCOUNTER — Encounter: Payer: Self-pay | Admitting: Internal Medicine

## 2012-08-08 VITALS — BP 120/80 | HR 84 | Temp 98.7°F | Resp 16 | Ht 64.0 in | Wt 151.8 lb

## 2012-08-08 DIAGNOSIS — G589 Mononeuropathy, unspecified: Secondary | ICD-10-CM

## 2012-08-08 DIAGNOSIS — J189 Pneumonia, unspecified organism: Secondary | ICD-10-CM

## 2012-08-08 DIAGNOSIS — G629 Polyneuropathy, unspecified: Secondary | ICD-10-CM | POA: Insufficient documentation

## 2012-08-08 MED ORDER — PREGABALIN 50 MG PO CAPS: 50.0000 mg | ORAL_CAPSULE | Freq: Three times a day (TID) | ORAL | Status: DC

## 2012-08-08 MED ORDER — OXYCODONE-ACETAMINOPHEN 10-325 MG PO TABS: 1.0000 | ORAL_TABLET | ORAL | Status: DC | PRN

## 2012-08-08 NOTE — Progress Notes (Signed)
Pt here for f/u s/p Community acquired PNA diag last Saturday in [facility] ER. Pt finished atb therapy today.c/o sob with exert and pain on left rib cage area with inhalation. cxr revealed pleurisy. Pain meds prescribed. sats 94% r/a. Hr 134

## 2012-08-08 NOTE — Patient Instructions (Addendum)
Neuropathy Neuropathy means your peripheral nerves are not working normally. Peripheral nerves are the nerves outside the brain and spinal cord. Messages between the brain and the rest of the body do not work properly with peripheral nerve disorders. CAUSES There are many different causes of peripheral nerve disorders. These include:  Injury.   Infections.   Diabetes.   Vitamin deficiency.   Poor circulation.   Alcoholism.   Exposure to toxins.   Drug effects.   Tumors.   Kidney disease.  SYMPTOMS  Tingling, burning, pain, and numbness in the extremities.   Weakness and loss of muscle tone and size.  DIAGNOSIS Blood tests and special studies of nerve function may help confirm the diagnosis.  TREATMENT  Treatment includes adopting healthy life habits.   A good diet, vitamin supplements, and mild pain medicine may be needed.   Avoid known toxins such as alcohol, tobacco, and recreational drugs.   Anti-convulsant medicines are helpful in some types of neuropathy.  Make a follow-up appointment with your caregiver to be sure you are getting better with treatment.  SEEK IMMEDIATE MEDICAL CARE IF:   You have breathing problems.   You have severe or uncontrolled pain.   You notice extreme weakness or you feel faint.   You are not better after 1 week or if you have worse symptoms.  Document Released: 02/26/2004 Document Revised: 09/30/2010 Document Reviewed: 01/18/2005 [facility] Patient Information 2012 [place], [place].  Anxiety and Panic Attacks Please note that the patient has been taking ativan 1 mg every 8 hours PRN for anxiety which provides significant relief. Your caregiver has informed you that you are having an anxiety or panic attack. There may be many forms of this. Most of the time these attacks come suddenly and without warning. They come at any time of day, including periods of sleep, and at any time of life. They may be strong and unexplained. Although  panic attacks are very scary, they are physically harmless. Sometimes the cause of your anxiety is not known. Anxiety is a protective mechanism of the body in its fight or flight mechanism. Most of these perceived danger situations are actually nonphysical situations (such as anxiety over losing a job). CAUSES  The causes of an anxiety or panic attack are many. Panic attacks may occur in otherwise healthy people given a certain set of circumstances. There may be a genetic cause for panic attacks. Some medications may also have anxiety as a side effect. SYMPTOMS  Some of the most common feelings are:  Intense terror.  Dizziness, feeling faint.  Hot and cold flashes.  Fear of going crazy.  Feelings that nothing is real.  Sweating.  Shaking.  Chest pain or a fast heartbeat (palpitations).  Smothering, choking sensations.  Feelings of impending doom and that death is near.  Tingling of extremities, this may be from over-breathing.  Altered reality (derealization).  Being detached from yourself (depersonalization). Several symptoms can be present to make up anxiety or panic attacks. DIAGNOSIS  The evaluation by your caregiver will depend on the type of symptoms you are experiencing. The diagnosis of anxiety or panic attack is made when no physical illness can be determined to be a cause of the symptoms. TREATMENT  Treatment to prevent anxiety and panic attacks may include:  Avoidance of circumstances that cause anxiety.  Reassurance and relaxation.  Regular exercise.  Relaxation therapies, such as yoga.  Psychotherapy with a psychiatrist or therapist.  Avoidance of caffeine, alcohol and illegal drugs.  Prescribed  medication. SEEK IMMEDIATE MEDICAL CARE IF:   You experience panic attack symptoms that are different than your usual symptoms.  You have any worsening or concerning symptoms. Document Released: 01/18/2005 Document Revised: 04/12/2011 Document Reviewed:  05/22/2009 [facility] Patient Information 2014 [place], [place].

## 2012-08-08 NOTE — Progress Notes (Signed)
Patient ID: [name] [name], female   DOB: [birth year], 47 y.o.   MRN: [id number]  CC: follow up after recent hospitalization  HPI: 47 year old female with past medical history of anxiety, bronchitis, endometriosis who presented to our clinic for follow up after recent hospitalization for pneumonia. Patient completed her Levaquin course. Right now she is complaining of left-sided rib cage pain. She also reports being very nervous because she is moving to [facility]. No chest pain or palpitations. She does report feeling short of breath but is able to complete full sentences. No complaints of abdominal pain, nausea or vomiting. No suicidal or homicidal thoughts. No depression. She does report having really bad nephropathy in her arms and legs for over past month.  Allergies  Allergen Reactions  . Sulfa Antibiotics Anaphylaxis  . Latex Hives  . Reglan (Metoclopramide) Nausea And Vomiting  . Zofran (Ondansetron Hcl) Nausea And Vomiting  . Clindamycin/Lincomycin Rash  . Morphine And Related Rash    Can have with benadryl  . Penicillins Rash   Past Medical History  Diagnosis Date  . Anxiety   . Thyroid disease   . Substance abuse   . Pneumonia   . Bronchitis   . Kidney stone   . Endometriosis    Current Outpatient Prescriptions on File Prior to Visit  Medication Sig Dispense Refill  . acetaminophen (TYLENOL) 500 MG tablet Take 500-1,000 mg by mouth every 6 (six) hours as needed for pain.       Marland Kitchen albuterol (PROVENTIL HFA;VENTOLIN HFA) 108 (90 BASE) MCG/ACT inhaler Inhale 2 puffs into the lungs every 4 (four) hours as needed for wheezing or shortness of breath (cough).  1 Inhaler  0  . azithromycin (ZITHROMAX Z-PAK) 250 MG tablet 2 po day one, then 1 daily x 4 days  5 tablet  0  . chlorpheniramine-HYDROcodone (TUSSIONEX PENNKINETIC ER) 10-8 MG/5ML LQCR Take 5 mLs by mouth every 12 (twelve) hours.  140 mL  0  . ibuprofen (ADVIL,MOTRIN) 200 MG tablet Take 400 mg by mouth every 6 (six)  hours as needed for pain.      Marland Kitchen LORazepam (ATIVAN) 1 MG tablet Take 1 tablet (1 mg total) by mouth every 12 (twelve) hours as needed for anxiety.  60 tablet  0  . Multiple Vitamin (MULTIVITAMIN WITH MINERALS) TABS Take 1 tablet by mouth every evening.       Marland Kitchen oxybutynin (DITROPAN-XL) 10 MG 24 hr tablet Take 10 mg by mouth every evening.      . predniSONE (DELTASONE) 20 MG tablet 3 tabs po daily x 3 days, then 2 tabs x 3 days, then 1.5 tabs x 3 days, then 1 tab x 3 days, then 0.5 tabs x 3 days  27 tablet  0  . promethazine (PHENERGAN) 50 MG tablet Take 1 tablet (50 mg total) by mouth every 6 (six) hours as needed for nausea.  30 tablet  0  . QUEtiapine (SEROQUEL) 50 MG tablet Take 50 mg by mouth 2 (two) times daily.       . traZODone (DESYREL) 100 MG tablet Take 100 mg by mouth at bedtime as needed for sleep.       No current facility-administered medications on file prior to visit.   Family History  Problem Relation Age of Onset  . Thyroid disease Mother   . Thyroid disease Sister    History   Social History  . Marital Status: Single    Spouse Name: N/A    Number  of Children: N/A  . Years of Education: N/A   Occupational History  . Not on file.   Social History Main Topics  . Smoking status: Current Every Day Smoker  . Smokeless tobacco: Never Used  . Alcohol Use: Yes     Comment: socially  . Drug Use: Yes    Special: Marijuana     Comment: occasionally  . Sexually Active: Yes    Birth Control/ Protection: None   Other Topics Concern  . Not on file   Social History Narrative  . No narrative on file    Review of Systems  Constitutional: Negative for fever, chills, diaphoresis, activity change, appetite change and fatigue.  HENT: Negative for ear pain, nosebleeds, congestion, facial swelling, rhinorrhea, neck pain, neck stiffness and ear discharge.   Eyes: Negative for pain, discharge, redness, itching and visual disturbance.  Respiratory: Negative for cough, choking,  chest tightness, shortness of breath, wheezing and stridor.   Cardiovascular: Negative for chest pain, palpitations and leg swelling.  Gastrointestinal: Negative for abdominal distention.  Genitourinary: Negative for dysuria, urgency, frequency, hematuria, flank pain, decreased urine volume, difficulty urinating and dyspareunia.  Musculoskeletal: Negative for back pain, joint swelling, arthralgias and gait problem.  Neurological: Negative for dizziness, tremors, seizures, syncope, facial asymmetry, speech difficulty, weakness, light-headedness, positive for numbness.  Hematological: Negative for adenopathy. Does not bruise/bleed easily.  Psychiatric/Behavioral: Negative for hallucinations, behavioral problems, confusion, dysphoric mood, decreased concentration and agitation.    Objective:   Filed Vitals:   08/08/12 1516  BP: 120/80  Pulse: 84  Temp: 98.7 F (37.1 C)  Resp: 16    Physical Exam  Constitutional: Appears well-developed and well-nourished. No distress.  HENT: Normocephalic. External right and left ear normal. Oropharynx is clear and moist.  Eyes: Conjunctivae and EOM are normal. PERRLA, no scleral icterus.  Neck: Normal ROM. Neck supple. No JVD. No tracheal deviation. No thyromegaly.  CVS: RRR, S1/S2 +, no murmurs, no gallops, no carotid bruit.  Pulmonary: Effort and breath sounds normal, no stridor, rhonchi, wheezes, rales.  Abdominal: Soft. BS +,  no distension, tenderness, rebound or guarding.  Musculoskeletal: Normal range of motion. No edema and no tenderness.  Lymphadenopathy: No lymphadenopathy noted, cervical, inguinal. Neuro: Alert. Normal reflexes, muscle tone coordination. No cranial nerve deficit. Skin: Skin is warm and dry. No rash noted. Not diaphoretic. No erythema. No pallor.  Psychiatric: Normal mood and affect. Behavior, judgment, thought content normal.   Lab Results  Component Value Date   WBC 6.5 07/29/2012   HGB 11.3* 07/29/2012   HCT 32.7*  07/29/2012   MCV 87.0 07/29/2012   PLT 296 07/29/2012   Lab Results  Component Value Date   CREATININE 0.57 07/29/2012   BUN <3* 07/29/2012   NA 138 07/29/2012   K 3.2* 07/29/2012   CL 102 07/29/2012   CO2 27 07/29/2012    No results found for this basename: HGBA1C   Lipid Panel  No results found for this basename: chol, trig, hdl, cholhdl, vldl, ldlcalc       Assessment and plan:   Patient Active Problem List   Diagnosis Date Noted  . CAP (community acquired pneumonia) 07/26/2012    Priority: High - stable  . Neuropathy - start lyrica 50 mg TID 08/08/2012

## 2012-12-06 ENCOUNTER — Encounter: Payer: Self-pay | Admitting: Internal Medicine

## 2012-12-06 ENCOUNTER — Ambulatory Visit: Payer: Self-pay | Attending: Internal Medicine | Admitting: Internal Medicine

## 2012-12-06 ENCOUNTER — Other Ambulatory Visit: Payer: Self-pay | Admitting: Internal Medicine

## 2012-12-06 VITALS — BP 133/91 | HR 123 | Temp 98.5°F | Resp 16 | Ht 64.0 in | Wt 150.0 lb

## 2012-12-06 DIAGNOSIS — Z9049 Acquired absence of other specified parts of digestive tract: Secondary | ICD-10-CM | POA: Insufficient documentation

## 2012-12-06 DIAGNOSIS — K2970 Gastritis, unspecified, without bleeding: Secondary | ICD-10-CM

## 2012-12-06 DIAGNOSIS — F411 Generalized anxiety disorder: Secondary | ICD-10-CM

## 2012-12-06 MED ORDER — OMEPRAZOLE 40 MG PO CPDR: 40.0000 mg | DELAYED_RELEASE_CAPSULE | Freq: Every day | ORAL | Status: DC

## 2012-12-06 MED ORDER — SUCRALFATE 1 G PO TABS: 1.0000 g | ORAL_TABLET | Freq: Three times a day (TID) | ORAL | Status: DC

## 2012-12-06 MED ORDER — OXYCODONE-ACETAMINOPHEN 10-325 MG PO TABS
1.0000 | ORAL_TABLET | Freq: Four times a day (QID) | ORAL | Status: DC | PRN
Start: 2012-12-06 — End: 2012-12-14

## 2012-12-06 MED ORDER — DIAZEPAM 5 MG PO TABS: 5.0000 mg | ORAL_TABLET | Freq: Two times a day (BID) | ORAL | Status: DC | PRN

## 2012-12-06 NOTE — Progress Notes (Unsigned)
Patient ID: [name] [name], female   DOB: Aug 10, 1965, 47 y.o.   MRN: [id number]   HPI: This is a 47 year old female who complains of severe upper abdominal pain which started a few weeks ago. She believes that it is gastritis. She does not have insurance. She has been taking over-the-counter Pepcid twice a day and Carafate but has run out of Carafate. She states that the pain is so severe now that she is not able to eat and is eating well exacerbated.  Also complains of severe anxiety which is worse when her pain is worse. She was given Xanax in the past which was ineffective. Her husband takes Valium and she tried one of these which helped her significantly. Allergies  Allergen Reactions  . Sulfa Antibiotics Anaphylaxis  . Latex Hives  . Reglan [Metoclopramide] Nausea And Vomiting  . Zofran [Ondansetron Hcl] Nausea And Vomiting  . Clindamycin/Lincomycin Rash  . Morphine And Related Rash    Can have with benadryl  . Penicillins Rash   Past Medical History  Diagnosis Date  . Anxiety   . Thyroid disease   . Substance abuse   . Pneumonia   . Bronchitis   . Kidney stone   . Endometriosis     Family History  Problem Relation Age of Onset  . Thyroid disease Mother   . Thyroid disease Sister    History   Social History  . Marital Status: Single    Spouse Name: N/A    Number of Children: N/A  . Years of Education: N/A   Occupational History  . Not on file.   Social History Main Topics  . Smoking status: Current Every Day Smoker  . Smokeless tobacco: Never Used  . Alcohol Use: Yes     Comment: socially  . Drug Use: Yes    Special: Marijuana     Comment: occasionally  . Sexual Activity: Yes    Birth Control/ Protection: None   Other Topics Concern  . Not on file   Social History Narrative  . No narrative on file    Review of Systems ______ Constitutional: Negative for fever, chills, diaphoresis, activity change, appetite change and fatigue. ____ HENT: Negative for  ear pain, nosebleeds, congestion, facial swelling, rhinorrhea, neck pain, neck stiffness and ear discharge.  ____ Eyes: Negative for pain, discharge, redness, itching and visual disturbance. ____ Respiratory: Negative for cough, choking, chest tightness, shortness of breath, wheezing and stridor.  ____ Cardiovascular: Negative for chest pain, palpitations and leg swelling. ____ Gastrointestinal: Negative for Nausea/ Vomiting/ Diarrhea or Consitpation Genitourinary: Negative for dysuria, urgency, frequency, hematuria, flank pain, decreased urine volume, difficulty urinating and dyspareunia. ____ Musculoskeletal: Negative for back pain, joint swelling, arthralgias and gait problem. ________ Neurological: Negative for dizziness, tremors, seizures, syncope, facial asymmetry, speech difficulty, weakness, light-headedness, numbness and headaches. ____ Hematological: Negative for adenopathy. Does not bruise/bleed easily. ____ Psychiatric/Behavioral: Negative for hallucinations, behavioral problems, confusion, dysphoric mood, decreased concentration and agitation. ______   Objective:  There were no vitals filed for this visit. There were no vitals filed for this visit.  Physical Exam ______ Constitutional: Appears well-developed and well-nourished. No distress. ____ HENT: Normocephalic. External right and left ear normal. Oropharynx is clear and moist. ____ Eyes: Conjunctivae and EOM are normal. PERRLA, no scleral icterus. ____ Neck: Normal ROM. Neck supple. No JVD. No tracheal deviation. No thyromegaly. ____ CVS: RRR, S1/S2 +, no murmurs, no gallops, no carotid bruit.  Pulmonary: Effort and breath sounds normal, no stridor, rhonchi, wheezes, rales.  Abdominal: Soft. BS +,  no distension, tenderness, rebound or guarding. ________ Musculoskeletal: Normal range of motion. No edema and no tenderness. ____ Neuro: Alert. Normal reflexes, muscle tone coordination. No cranial nerve deficit. Skin: Skin is  warm and dry. No rash noted. Not diaphoretic. No erythema. No pallor. ____ Psychiatric: Normal mood and affect. Behavior, judgment, thought content normal. __  Lab Results  Component Value Date   WBC 6.5 07/29/2012   HGB 11.3* 07/29/2012   HCT 32.7* 07/29/2012   MCV 87.0 07/29/2012   PLT 296 07/29/2012   Lab Results  Component Value Date   CREATININE 0.57 07/29/2012   BUN <3* 07/29/2012   NA 138 07/29/2012   K 3.2* 07/29/2012   CL 102 07/29/2012   CO2 27 07/29/2012    No results found for this basename: HGBA1C   Lipid Panel  No results found for this basename: chol, trig, hdl, cholhdl, vldl, ldlcalc        Patient Active Problem List   Diagnosis Date Noted  . Neuropathy 08/08/2012  . Hypokalemia 07/27/2012  . CAP (community acquired pneumonia) 07/26/2012       Assessment and plan: Gastritis: -Patient was on a course of prednisone for acute bronchitis and this may have worsened her gastritis. -Given prescriptions for Prilosec 40 mg twice a day and Carafate 4 times a day and advised to continue Pepcid -Will also check stool antigen for H. Pylori -I will give her a one time prescription of Percocet and until above-mentioned medications start working-she understands that she cannot return for more Percocet prescriptions -GI referral has been made but she does not yet have an orange card  Anxiety -I have discussed that she will need to go to [facility] for appropriate treatment -I will give her a one time prescription for Valium until she is able to get in to see someone at [facility]  Flu shot offered but declined Calvert Cantor, MD

## 2012-12-06 NOTE — Progress Notes (Unsigned)
Pt is here for a f/u visit. Pt is still having complications from gastritis. The pain is a 7, aching, burning and its constant. Pt for 2 months has an on and off fever. Pt reports having severe anxiety.

## 2012-12-07 ENCOUNTER — Other Ambulatory Visit: Payer: Self-pay

## 2012-12-09 LAB — HELICOBACTER PYLORI  SPECIAL ANTIGEN

## 2012-12-13 ENCOUNTER — Telehealth: Payer: Self-pay

## 2012-12-13 ENCOUNTER — Telehealth: Payer: Self-pay | Admitting: Internal Medicine

## 2012-12-13 ENCOUNTER — Emergency Department (HOSPITAL_COMMUNITY)
Admission: EM | Admit: 2012-12-13 | Discharge: 2012-12-13 | Disposition: A | Discharge status: Home / Self Care | Payer: Self-pay | Attending: Emergency Medicine | Admitting: Emergency Medicine

## 2012-12-13 ENCOUNTER — Encounter (HOSPITAL_COMMUNITY): Payer: Self-pay | Admitting: Emergency Medicine

## 2012-12-13 DIAGNOSIS — Z91040 Latex allergy status: Secondary | ICD-10-CM | POA: Insufficient documentation

## 2012-12-13 DIAGNOSIS — Z8709 Personal history of other diseases of the respiratory system: Secondary | ICD-10-CM | POA: Insufficient documentation

## 2012-12-13 DIAGNOSIS — Z9089 Acquired absence of other organs: Secondary | ICD-10-CM | POA: Insufficient documentation

## 2012-12-13 DIAGNOSIS — Z3202 Encounter for pregnancy test, result negative: Secondary | ICD-10-CM | POA: Insufficient documentation

## 2012-12-13 DIAGNOSIS — F411 Generalized anxiety disorder: Secondary | ICD-10-CM | POA: Insufficient documentation

## 2012-12-13 DIAGNOSIS — Z87442 Personal history of urinary calculi: Secondary | ICD-10-CM | POA: Insufficient documentation

## 2012-12-13 DIAGNOSIS — R509 Fever, unspecified: Secondary | ICD-10-CM | POA: Insufficient documentation

## 2012-12-13 DIAGNOSIS — IMO0002 Reserved for concepts with insufficient information to code with codable children: Secondary | ICD-10-CM | POA: Insufficient documentation

## 2012-12-13 DIAGNOSIS — Z8742 Personal history of other diseases of the female genital tract: Secondary | ICD-10-CM | POA: Insufficient documentation

## 2012-12-13 DIAGNOSIS — R110 Nausea: Secondary | ICD-10-CM | POA: Insufficient documentation

## 2012-12-13 DIAGNOSIS — R1011 Right upper quadrant pain: Secondary | ICD-10-CM | POA: Insufficient documentation

## 2012-12-13 DIAGNOSIS — F17200 Nicotine dependence, unspecified, uncomplicated: Secondary | ICD-10-CM | POA: Insufficient documentation

## 2012-12-13 DIAGNOSIS — Z79899 Other long term (current) drug therapy: Secondary | ICD-10-CM | POA: Insufficient documentation

## 2012-12-13 DIAGNOSIS — Z792 Long term (current) use of antibiotics: Secondary | ICD-10-CM | POA: Insufficient documentation

## 2012-12-13 DIAGNOSIS — Z880 Allergy status to penicillin: Secondary | ICD-10-CM | POA: Insufficient documentation

## 2012-12-13 DIAGNOSIS — Z862 Personal history of diseases of the blood and blood-forming organs and certain disorders involving the immune mechanism: Secondary | ICD-10-CM | POA: Insufficient documentation

## 2012-12-13 DIAGNOSIS — Z8701 Personal history of pneumonia (recurrent): Secondary | ICD-10-CM | POA: Insufficient documentation

## 2012-12-13 DIAGNOSIS — R109 Unspecified abdominal pain: Secondary | ICD-10-CM

## 2012-12-13 DIAGNOSIS — Z90710 Acquired absence of both cervix and uterus: Secondary | ICD-10-CM | POA: Insufficient documentation

## 2012-12-13 DIAGNOSIS — Z8639 Personal history of other endocrine, nutritional and metabolic disease: Secondary | ICD-10-CM | POA: Insufficient documentation

## 2012-12-13 LAB — CBC WITH DIFFERENTIAL/PLATELET
Basophils Absolute: 0 10*3/uL (ref 0.0–0.1)
Eosinophils Absolute: 0 10*3/uL (ref 0.0–0.7)
Eosinophils Relative: 1 % (ref 0–5)
HCT: 35.6 % — ABNORMAL LOW (ref 36.0–46.0)
Lymphocytes Relative: 29 % (ref 12–46)
Lymphs Abs: 1.5 10*3/uL (ref 0.7–4.0)
MCV: 89.9 fL (ref 78.0–100.0)
Monocytes Absolute: 0.3 10*3/uL (ref 0.1–1.0)
Monocytes Relative: 6 % (ref 3–12)
Neutro Abs: 3.3 10*3/uL (ref 1.7–7.7)
Platelets: 268 10*3/uL (ref 150–400)
RBC: 3.96 MIL/uL (ref 3.87–5.11)
RDW: 12.6 % (ref 11.5–15.5)
WBC: 5.2 10*3/uL (ref 4.0–10.5)

## 2012-12-13 LAB — COMPREHENSIVE METABOLIC PANEL
ALT: 20 U/L (ref 0–35)
AST: 19 U/L (ref 0–37)
Alkaline Phosphatase: 120 U/L — ABNORMAL HIGH (ref 39–117)
BUN: 5 mg/dL — ABNORMAL LOW (ref 6–23)
CO2: 28 mEq/L (ref 19–32)
Chloride: 102 mEq/L (ref 96–112)
GFR calc Af Amer: >90 mL/min (ref >90)
GFR calc non Af Amer: >90 mL/min (ref >90)
Glucose, Bld: 96 mg/dL (ref 70–99)
Sodium: 139 mEq/L (ref 135–145)
Total Bilirubin: <0.1 mg/dL — ABNORMAL LOW (ref 0.3–1.2)
Total Protein: 6.8 g/dL (ref 6.0–8.3)

## 2012-12-13 LAB — URINALYSIS, ROUTINE W REFLEX MICROSCOPIC
Bilirubin Urine: NEGATIVE
Ketones, ur: NEGATIVE mg/dL
Specific Gravity, Urine: 1.006 (ref 1.005–1.030)
Urobilinogen, UA: 0.2 mg/dL (ref 0.0–1.0)
pH: 7 (ref 5.0–8.0)

## 2012-12-13 LAB — POCT PREGNANCY, URINE: Preg Test, Ur: NEGATIVE

## 2012-12-13 MED ORDER — OXYCODONE-ACETAMINOPHEN 5-325 MG PO TABS
2.0000 | ORAL_TABLET | Freq: Once | ORAL | Status: AC
  Administered 2012-12-13: 2 via ORAL
  Filled 2012-12-13: qty 2

## 2012-12-13 MED ORDER — OXYCODONE-ACETAMINOPHEN 5-325 MG PO TABS: 1.0000 | ORAL_TABLET | ORAL | Status: DC | PRN

## 2012-12-13 NOTE — Telephone Encounter (Signed)
Patient would like her lab results

## 2012-12-13 NOTE — ED Provider Notes (Signed)
CSN: [id number]     Arrival date & time 12/13/12  1500 History   First MD Initiated Contact with Patient 12/13/12 1513     No chief complaint on file.   HPI  [name] [name] is a 47 y.o. female with a PMH of anxiety, thyroid disease, substance abuse, bronchitis, kidney stones, and endometriosis who presents to the ED for evaluation of abdominal pain.  History was provided by the patient.  Patient states she has had abdominal pain for the past 6 weeks.  Her pain is located in the RUQ with radiation across her upper abdomen.  Her pain is constant with fluctuations in severity.  Her pain is worse about 20 minutes after eating.  She has been taking Percocet, Carafate, and Prilosec which has been helping her pain.  She has seen her PCP and the plan is to have an EGD scheduled.  She is waiting on her orange card before setting up an appointment for this.  She had testing for H Pylori last week when she saw her PCP but does not yet have the results of this.  She has had alternating loose stools with constipation.  She has been taking colace for this.  She has had a few episodes of blood mixed in her stool about once a week.  She denies any rectal bleeding/pain/perfuse bleeding.  No black tarry stools.  She had emesis last week but this has resolved.  She has had a 15 pound weight loss over the past 6 weeks due to her pain with eating.  She denies any dysuria, hematuria, vaginal bleeding/discharge, or vaginal pain.  Previous abdominal surgeries include a hysterectomy, cholecystectomy, appendectomy, and exploratory abdominal laparotomy.  No recent travel.  Patient is a tobacco user.  She has had a low grade fever max temp 100F over the past month.     Past Medical History  Diagnosis Date  . Anxiety   . Thyroid disease   . Substance abuse   . Pneumonia   . Bronchitis   . Kidney stone   . Endometriosis    Past Surgical History  Procedure Laterality Date  . Abdominal hysterectomy    . Cholecystectomy    .  Appendectomy    . Ex laporotomy     Family History  Problem Relation Age of Onset  . Thyroid disease Mother   . Thyroid disease Sister    History  Substance Use Topics  . Smoking status: Current Every Day Smoker -- 0.50 packs/day  . Smokeless tobacco: Never Used  . Alcohol Use: No     Comment: socially   OB History   Grav Para Term Preterm Abortions TAB SAB Ect Mult Living                 Review of Systems  Constitutional: Positive for fever (low grade), appetite change (due to pain) and unexpected weight change. Negative for chills, diaphoresis, activity change and fatigue.  HENT: Negative for congestion, rhinorrhea and sore throat.   Respiratory: Negative for cough and shortness of breath.   Cardiovascular: Negative for chest pain and leg swelling.  Gastrointestinal: Positive for nausea, vomiting (intermittent), abdominal pain, diarrhea (intermittent), constipation (intermittent) and blood in stool (intermittent). Negative for anal bleeding.  Genitourinary: Negative for dysuria, hematuria, decreased urine volume, vaginal bleeding, vaginal discharge and vaginal pain.  Musculoskeletal: Negative for back pain, myalgias and neck pain.  Skin: Negative for wound.  Neurological: Negative for dizziness, weakness, light-headedness, numbness and headaches.    Allergies  Sulfa antibiotics; Latex; Reglan; Zofran; Clindamycin/lincomycin; Morphine and related; and Penicillins  Home Medications   Current Outpatient Rx  Name  Route  Sig  Dispense  Refill  . acetaminophen (TYLENOL) 500 MG tablet   Oral   Take 500-1,000 mg by mouth every 6 (six) hours as needed for pain.          Marland Kitchen albuterol (PROVENTIL HFA;VENTOLIN HFA) 108 (90 BASE) MCG/ACT inhaler   Inhalation   Inhale 2 puffs into the lungs every 4 (four) hours as needed for wheezing or shortness of breath (cough).   1 Inhaler   0   . azithromycin (ZITHROMAX Z-PAK) 250 MG tablet      2 po day one, then 1 daily x 4 days   5  tablet   0   . chlorpheniramine-HYDROcodone (TUSSIONEX PENNKINETIC ER) 10-8 MG/5ML LQCR   Oral   Take 5 mLs by mouth every 12 (twelve) hours.   140 mL   0   . diazepam (VALIUM) 5 MG tablet   Oral   Take 1 tablet (5 mg total) by mouth every 12 (twelve) hours as needed.   30 tablet   0   . ibuprofen (ADVIL,MOTRIN) 200 MG tablet   Oral   Take 400 mg by mouth every 6 (six) hours as needed for pain.         Marland Kitchen LORazepam (ATIVAN) 1 MG tablet   Oral   Take 1 tablet (1 mg total) by mouth every 12 (twelve) hours as needed for anxiety.   60 tablet   0   . Multiple Vitamin (MULTIVITAMIN WITH MINERALS) TABS   Oral   Take 1 tablet by mouth every evening.          Marland Kitchen omeprazole (PRILOSEC) 40 MG capsule   Oral   Take 1 capsule (40 mg total) by mouth daily.   60 capsule   3   . oxybutynin (DITROPAN-XL) 10 MG 24 hr tablet   Oral   Take 10 mg by mouth every evening.         Marland Kitchen oxyCODONE-acetaminophen (PERCOCET) 10-325 MG per tablet   Oral   Take 1 tablet by mouth every 6 (six) hours as needed for pain.   30 tablet   0   . predniSONE (DELTASONE) 20 MG tablet      3 tabs po daily x 3 days, then 2 tabs x 3 days, then 1.5 tabs x 3 days, then 1 tab x 3 days, then 0.5 tabs x 3 days   27 tablet   0   . pregabalin (LYRICA) 50 MG capsule   Oral   Take 1 capsule (50 mg total) by mouth 3 (three) times daily.   90 capsule   1   . promethazine (PHENERGAN) 50 MG tablet   Oral   Take 1 tablet (50 mg total) by mouth every 6 (six) hours as needed for nausea.   30 tablet   0   . QUEtiapine (SEROQUEL) 50 MG tablet   Oral   Take 50 mg by mouth 2 (two) times daily.          . sucralfate (CARAFATE) 1 G tablet   Oral   Take 1 tablet (1 g total) by mouth 4 (four) times daily -  with meals and at bedtime.   30 tablet   6   . traZODone (DESYREL) 100 MG tablet   Oral   Take 100 mg by mouth at bedtime as needed for sleep.  BP 146/88  Pulse 106  Temp(Src) 98.8 F (37.1  C) (Oral)  Resp 18  SpO2 99%  Filed Vitals:   12/13/12 1525 12/13/12 1828  BP: 146/88 125/93  Pulse: 106 78  Temp: 98.8 F (37.1 C) 97.8 F (36.6 C)  TempSrc: Oral Oral  Resp: 18 18  SpO2: 99% 100%    Physical Exam  Nursing note and vitals reviewed. Constitutional: She is oriented to person, place, and time. She appears well-developed and well-nourished. No distress.  HENT:  Head: Normocephalic and atraumatic.  Right Ear: External ear normal.  Left Ear: External ear normal.  Nose: Nose normal.  Mouth/Throat: Oropharynx is clear and moist. No oropharyngeal exudate.  Eyes: Conjunctivae are normal. Right eye exhibits no discharge. Left eye exhibits no discharge.  Neck: Neck supple.  Cardiovascular: Normal rate, regular rhythm, normal heart sounds and intact distal pulses.  Exam reveals no gallop and no friction rub.   No murmur heard. Pulmonary/Chest: Effort normal and breath sounds normal. No respiratory distress. She has no wheezes. She has no rales. She exhibits no tenderness.  Abdominal: Soft. Bowel sounds are normal. She exhibits no distension and no mass. There is no tenderness. There is no rebound and no guarding.  Musculoskeletal: Normal range of motion. She exhibits no edema and no tenderness.  No CVA or lumbar tenderness bilaterally.  Patient able to ambulate without difficulty or ataxia  Neurological: She is alert and oriented to person, place, and time.  Skin: Skin is warm and dry. She is not diaphoretic.    ED Course  Procedures (including critical care time) Labs Review Labs Reviewed - No data to display Imaging Review No results found.  EKG Interpretation   None      Results for orders placed during the hospital encounter of 12/13/12  URINALYSIS, ROUTINE W REFLEX MICROSCOPIC      Result Value Range   Color, Urine YELLOW  YELLOW   APPearance CLEAR  CLEAR   Specific Gravity, Urine 1.006  1.005 - 1.030   pH 7.0  5.0 - 8.0   Glucose, UA NEGATIVE   NEGATIVE mg/dL   Hgb urine dipstick TRACE (*) NEGATIVE   Bilirubin Urine NEGATIVE  NEGATIVE   Ketones, ur NEGATIVE  NEGATIVE mg/dL   Protein, ur NEGATIVE  NEGATIVE mg/dL   Urobilinogen, UA 0.2  0.0 - 1.0 mg/dL   Nitrite NEGATIVE  NEGATIVE   Leukocytes, UA NEGATIVE  NEGATIVE  CBC WITH DIFFERENTIAL      Result Value Range   WBC 5.2  4.0 - 10.5 K/uL   RBC 3.96  3.87 - 5.11 MIL/uL   Hemoglobin 12.5  12.0 - 15.0 g/dL   HCT 62.1 (*) 30.8 - 65.7 %   MCV 89.9  78.0 - 100.0 fL   MCH 31.6  26.0 - 34.0 pg   MCHC 35.1  30.0 - 36.0 g/dL   RDW 84.6  96.2 - 95.2 %   Platelets 268  150 - 400 K/uL   Neutrophils Relative % 64  43 - 77 %   Neutro Abs 3.3  1.7 - 7.7 K/uL   Lymphocytes Relative 29  12 - 46 %   Lymphs Abs 1.5  0.7 - 4.0 K/uL   Monocytes Relative 6  3 - 12 %   Monocytes Absolute 0.3  0.1 - 1.0 K/uL   Eosinophils Relative 1  0 - 5 %   Eosinophils Absolute 0.0  0.0 - 0.7 K/uL   Basophils Relative 0  0 - 1 %  Basophils Absolute 0.0  0.0 - 0.1 K/uL  COMPREHENSIVE METABOLIC PANEL      Result Value Range   Sodium 139  135 - 145 mEq/L   Potassium 3.8  3.5 - 5.1 mEq/L   Chloride 102  96 - 112 mEq/L   CO2 28  19 - 32 mEq/L   Glucose, Bld 96  70 - 99 mg/dL   BUN 5 (*) 6 - 23 mg/dL   Creatinine, Ser 9.60  0.50 - 1.10 mg/dL   Calcium 9.5  8.4 - 45.4 mg/dL   Total Protein 6.8  6.0 - 8.3 g/dL   Albumin 3.5  3.5 - 5.2 g/dL   AST 19  0 - 37 U/L   ALT 20  0 - 35 U/L   Alkaline Phosphatase 120 (*) 39 - 117 U/L   Total Bilirubin <0.1 (*) 0.3 - 1.2 mg/dL   GFR calc non Af Amer >90  >90 mL/min   GFR calc Af Amer >90  >90 mL/min  LIPASE, BLOOD      Result Value Range   Lipase 21  11 - 59 U/L  URINE MICROSCOPIC-ADD ON      Result Value Range   Squamous Epithelial / LPF RARE  RARE   RBC / HPF 0-2  <3 RBC/hpf  POCT PREGNANCY, URINE      Result Value Range   Preg Test, Ur NEGATIVE  NEGATIVE  OCCULT BLOOD, POC DEVICE      Result Value Range   Fecal Occult Bld NEGATIVE  NEGATIVE    MDM    Cassidey Barrales is a 47 y.o. female with a PMH of anxiety, thyroid disease, substance abuse, bronchitis, kidney stones, and endometriosis who presents to the ED for evaluation of abdominal pain.  Urine pregnancy, UA, CBC, CMP, lipase, and occult blood ordered.     Rechecks  4:41 PM = Patient refused IV.  Will get labs.    5:05 PM = Patient asking for something for pain.  States cannot get morphine because it makes her arm swell and itching.  She states Toradol does not work for her.  Rectal exam performed at bedside with RN staff present.  No anal fissures/hemorrhoids.  No gross blood.  Minimal stool present in the rectal vault.  Guaiac card sent.   6:20 PM = Patient states she feels a little better after Percocet.  No distress.      Etiology of abdominal pain x 6 weeks is unclear.  Possibly due to gastritis vs gastric/duodenal ulcers.  Patient has hx of cholecystectomy, appendectomy, and hysterectomy.  Labs were unremarkable.  Abdominal exam was benign.  Patient's pain improved throughout her ED visit.  She is currently being managed for her abdominal pain by her PCP and there are plans for an EGD however it has not yet been scheduled.  Patient was given a prescription for a few Percocet until she can see her PCP.  Patient in agreement with discharge and plan.  Significant other in the ED to drive her home.  Return precautions given.    Final impressions: 1. Abdominal pain      Luiz Iron PA-C   This patient was discussed with Dr. Vinetta Bergamo, PA-C 12/15/12 [phone]

## 2012-12-13 NOTE — Progress Notes (Signed)
[facility] provided pt with a [employer] application. Pt stated that she was already a patient at [facility] but declined my offer to set her up with an eligibility and enrollment appointment with [employer] program at this time.

## 2012-12-13 NOTE — Telephone Encounter (Signed)
Left message on machine  Labs results still pending

## 2012-12-13 NOTE — ED Notes (Signed)
Pt c/o of upper abd pain x6 weeks 10/10 increased after eating. . States that she went to the [facility] and had a H pylori test done on 11/5, no results yet. Nausea no vomiting. Blood and mucus in stool yesterday.

## 2012-12-13 NOTE — ED Notes (Signed)
Pt refused IV states that she can get medication IM if needed. MD notified.

## 2012-12-14 ENCOUNTER — Ambulatory Visit: Payer: Self-pay | Attending: Internal Medicine | Admitting: Internal Medicine

## 2012-12-14 VITALS — BP 159/111 | HR 92 | Temp 98.1°F | Resp 16 | Wt 152.2 lb

## 2012-12-14 DIAGNOSIS — R1013 Epigastric pain: Secondary | ICD-10-CM | POA: Insufficient documentation

## 2012-12-14 DIAGNOSIS — R109 Unspecified abdominal pain: Secondary | ICD-10-CM

## 2012-12-14 MED ORDER — CLONIDINE HCL 0.1 MG PO TABS: 0.1000 mg | ORAL_TABLET | Freq: Two times a day (BID) | ORAL | Status: DC

## 2012-12-14 MED ORDER — ALUM & MAG HYDROXIDE-SIMETH 500-450-40 MG/5ML PO SUSP: 15.0000 mL | Freq: Four times a day (QID) | ORAL | Status: DC | PRN

## 2012-12-14 MED ORDER — OMEPRAZOLE 40 MG PO CPDR: 40.0000 mg | DELAYED_RELEASE_CAPSULE | Freq: Two times a day (BID) | ORAL | Status: DC

## 2012-12-14 NOTE — Progress Notes (Signed)
Patient ID: [name] [name], female   DOB: 04/21/1965, 47 y.o.   MRN: [id number]  Patient Demographics  [name] [name], is a 47 y.o. female  WUJ:[id number]  [id number]  DOB - [birth year]  Chief Complaint  Patient presents with  . Abdominal Pain        Subjective:   [name] [name] with History of hypertension, anxiety, multiple ER visits for various different pains, who was seen here for epigastric pain a few weeks ago and placed on PPI, Carafate and one-time prescription of narcotics upon patient demand and clearly told that she would not get any further narcotic refills for this pain, she was again seen yesterday in the ER for epigastric pain, comes back with chief complaints of ongoing epigastric pain now says she is thrown up black colored vomitus and has been having bright red blood in stool along with black stool, she says she has hardly eaten anything in the last one month. Describes pain in the epigastric area with some radiation to the back, pain is intermittent worse with eating food better with abdominal rest . Has been present for 3-4 weeks now.  Denies any subjective complaints except as above, no active headache, no chest abdominal pain at this time, not short of breath. No focal weakness which is new.    Objective:    Patient Active Problem List   Diagnosis Date Noted  . S/P cholecystectomy 12/06/2012  . S/P appendectomy 12/06/2012  . Neuropathy 08/08/2012  . Hypokalemia 07/27/2012  . CAP (community acquired pneumonia) 07/26/2012     Filed Vitals:   12/14/12 1620  BP: 159/111  Pulse: 92  Temp: 98.1 F (36.7 C)  Resp: 16  Weight: 152 lb 3.2 oz (69.037 kg)  SpO2: 100%     Exam   Awake Alert, Oriented X 3, No new F.N deficits, Normal affect [place].AT,PERRAL Supple Neck,No JVD, No cervical lymphadenopathy appriciated.  Symmetrical Chest wall movement, Good air movement bilaterally, CTAB RRR,No Gallops,Rubs or new Murmurs, No Parasternal Heave +ve B.Sounds, Abd Soft,  mild epigastric tenderness however exam remains inconsistent upon distraction, No organomegaly appriciated, No rebound - guarding or rigidity. Hemoccult negative stool No Cyanosis, Clubbing or edema, No new Rash or bruise     Data Review   Lab Results  Component Value Date   WBC 5.2 12/13/2012   HGB 12.5 12/13/2012   HCT 35.6* 12/13/2012   MCV 89.9 12/13/2012   PLT 268 12/13/2012      Chemistry      Component Value Date/Time   NA 139 12/13/2012 1638   K 3.8 12/13/2012 1638   CL 102 12/13/2012 1638   CO2 28 12/13/2012 1638   BUN 5* 12/13/2012 1638   CREATININE 0.60 12/13/2012 1638      Component Value Date/Time   CALCIUM 9.5 12/13/2012 1638   ALKPHOS 120* 12/13/2012 1638   AST 19 12/13/2012 1638   ALT 20 12/13/2012 1638   BILITOT <0.1* 12/13/2012 1638       No results found for this basename: PSA      Prior to Admission medications   Medication Sig Start Date End Date Taking? Authorizing Provider  aluminum & magnesium hydroxide-simethicone (MYLANTA) 500-450-40 MG/5ML suspension Take 15 mLs by mouth every 6 (six) hours as needed for indigestion. 12/14/12   Leroy Sea, MD  cloNIDine (CATAPRES) 0.1 MG tablet Take 1 tablet (0.1 mg total) by mouth 2 (two) times daily. 12/14/12   Leroy Sea, MD  diazepam (VALIUM) 5 MG tablet Take 1  tablet (5 mg total) by mouth every 12 (twelve) hours as needed. 12/06/12   Calvert Cantor, MD  Multiple Vitamin (MULTIVITAMIN WITH MINERALS) TABS Take 1 tablet by mouth every evening.     Historical Provider, MD  omeprazole (PRILOSEC) 40 MG capsule Take 1 capsule (40 mg total) by mouth 2 (two) times daily. 12/14/12   Leroy Sea, MD  promethazine (PHENERGAN) 25 MG tablet Take 25 mg by mouth every 6 (six) hours as needed for nausea or vomiting.    Historical Provider, MD  sucralfate (CARAFATE) 1 G tablet Take 1 tablet (1 g total) by mouth 4 (four) times daily -  with meals and at bedtime. 12/06/12   Calvert Cantor, MD     Assessment  & Plan    Epigastric abdominal pain with history of hematemesis and melena, last episode yesterday. Hemoccult negative. H&H reviewed which is compared to one month ago is actually going up, patient had workup in the ER yesterday, she has a pending appointment with GI physician at [facility] for EGD and possible colonoscopy in the next few weeks, information provided to her in person. She appears to be in no distress distracted. Abdominal exam is benign, Hemoccult negative. Pylori antigen negative.  She did request getting narcotics today, however she was reminded that she was clearly told last visit that the narcotics she was getting was just one time. I have reviewed her chart extensively and there is mention of substance abuse during which his hospital and ER visits and even being belligerent when ER physicians have refused her to get narcotics. Please look at the note by Dr. Blinda Leatherwood on 08/05/2012.   Will check CBC, CMP and lipase. She already had hysterectomy and cholecystectomy in the past. Will send her to [facility] for a abdominal x-ray. She is advised to keep up her appointment at [facility] in the next few weeks. She will be placed on twice a day PPI, continue Carafate will add Mylanta for symptomatic relief.    She has been advised that if pain gets worse to go to the ER.    Hypertension. Placed on Catapres. We'll monitor in 2 weeks.      Routine health maintenance.    Leroy Sea M.D on 12/14/2012 at 4:47 PM

## 2012-12-14 NOTE — Progress Notes (Signed)
Patient was seen in the ED yesterday for abd pain Has been getting worse The ed did only blood work no imaging Has not been eating- very uncomfortable Feels bloated

## 2012-12-14 NOTE — Patient Instructions (Signed)
If symptoms get worse, worsening abdominal pain, blood in stool or vomitus. go to the ER.

## 2012-12-15 ENCOUNTER — Ambulatory Visit (HOSPITAL_COMMUNITY): Payer: Self-pay

## 2012-12-15 ENCOUNTER — Ambulatory Visit (HOSPITAL_COMMUNITY): Admission: RE | Admit: 2012-12-15 | Payer: Self-pay | Source: Ambulatory Visit

## 2012-12-15 NOTE — ED Provider Notes (Signed)
  Medical screening examination/treatment/procedure(s) were performed by non-physician practitioner and as supervising physician I was immediately available for consultation/collaboration.  EKG Interpretation   None          Arvin Abello, MD 12/15/12 2358 

## 2012-12-17 ENCOUNTER — Emergency Department (HOSPITAL_COMMUNITY): Payer: Self-pay

## 2012-12-17 ENCOUNTER — Telehealth (HOSPITAL_COMMUNITY): Payer: Self-pay | Admitting: *Deleted

## 2012-12-17 ENCOUNTER — Emergency Department (HOSPITAL_COMMUNITY)
Admission: EM | Admit: 2012-12-17 | Discharge: 2012-12-17 | Disposition: A | Discharge status: Home / Self Care | Payer: Self-pay | Attending: Emergency Medicine | Admitting: Emergency Medicine

## 2012-12-17 ENCOUNTER — Encounter (HOSPITAL_COMMUNITY): Payer: Self-pay | Admitting: Emergency Medicine

## 2012-12-17 DIAGNOSIS — Z9889 Other specified postprocedural states: Secondary | ICD-10-CM | POA: Insufficient documentation

## 2012-12-17 DIAGNOSIS — Z9089 Acquired absence of other organs: Secondary | ICD-10-CM | POA: Insufficient documentation

## 2012-12-17 DIAGNOSIS — Z87442 Personal history of urinary calculi: Secondary | ICD-10-CM | POA: Insufficient documentation

## 2012-12-17 DIAGNOSIS — Z8639 Personal history of other endocrine, nutritional and metabolic disease: Secondary | ICD-10-CM | POA: Insufficient documentation

## 2012-12-17 DIAGNOSIS — R1013 Epigastric pain: Secondary | ICD-10-CM | POA: Insufficient documentation

## 2012-12-17 DIAGNOSIS — F17200 Nicotine dependence, unspecified, uncomplicated: Secondary | ICD-10-CM | POA: Insufficient documentation

## 2012-12-17 DIAGNOSIS — F411 Generalized anxiety disorder: Secondary | ICD-10-CM | POA: Insufficient documentation

## 2012-12-17 DIAGNOSIS — R630 Anorexia: Secondary | ICD-10-CM | POA: Insufficient documentation

## 2012-12-17 DIAGNOSIS — Z79899 Other long term (current) drug therapy: Secondary | ICD-10-CM | POA: Insufficient documentation

## 2012-12-17 DIAGNOSIS — Z90710 Acquired absence of both cervix and uterus: Secondary | ICD-10-CM | POA: Insufficient documentation

## 2012-12-17 DIAGNOSIS — Z8709 Personal history of other diseases of the respiratory system: Secondary | ICD-10-CM | POA: Insufficient documentation

## 2012-12-17 DIAGNOSIS — Z8742 Personal history of other diseases of the female genital tract: Secondary | ICD-10-CM | POA: Insufficient documentation

## 2012-12-17 DIAGNOSIS — Z91040 Latex allergy status: Secondary | ICD-10-CM | POA: Insufficient documentation

## 2012-12-17 DIAGNOSIS — R197 Diarrhea, unspecified: Secondary | ICD-10-CM | POA: Insufficient documentation

## 2012-12-17 DIAGNOSIS — R112 Nausea with vomiting, unspecified: Secondary | ICD-10-CM | POA: Insufficient documentation

## 2012-12-17 DIAGNOSIS — Z862 Personal history of diseases of the blood and blood-forming organs and certain disorders involving the immune mechanism: Secondary | ICD-10-CM | POA: Insufficient documentation

## 2012-12-17 DIAGNOSIS — Z8701 Personal history of pneumonia (recurrent): Secondary | ICD-10-CM | POA: Insufficient documentation

## 2012-12-17 DIAGNOSIS — Z880 Allergy status to penicillin: Secondary | ICD-10-CM | POA: Insufficient documentation

## 2012-12-17 LAB — COMPREHENSIVE METABOLIC PANEL
ALT: 27 U/L (ref 0–35)
AST: 50 U/L — ABNORMAL HIGH (ref 0–37)
Albumin: 3.6 g/dL (ref 3.5–5.2)
Alkaline Phosphatase: 134 U/L — ABNORMAL HIGH (ref 39–117)
BUN: 7 mg/dL (ref 6–23)
CO2: 22 mEq/L (ref 19–32)
Chloride: 105 mEq/L (ref 96–112)
GFR calc Af Amer: >90 mL/min (ref >90)
GFR calc non Af Amer: >90 mL/min (ref >90)
Glucose, Bld: 98 mg/dL (ref 70–99)
Potassium: 3.5 mEq/L (ref 3.5–5.1)
Total Bilirubin: 0.2 mg/dL — ABNORMAL LOW (ref 0.3–1.2)
Total Protein: 7.1 g/dL (ref 6.0–8.3)

## 2012-12-17 LAB — LIPASE, BLOOD: Lipase: 24 U/L (ref 11–59)

## 2012-12-17 LAB — CBC WITH DIFFERENTIAL/PLATELET
Basophils Absolute: 0 10*3/uL (ref 0.0–0.1)
Basophils Relative: 0 % (ref 0–1)
HCT: 38.4 % (ref 36.0–46.0)
Lymphs Abs: 1.6 10*3/uL (ref 0.7–4.0)
MCH: 32.1 pg (ref 26.0–34.0)
MCHC: 35.9 g/dL (ref 30.0–36.0)
Monocytes Absolute: 0.2 10*3/uL (ref 0.1–1.0)
Neutro Abs: 4.1 10*3/uL (ref 1.7–7.7)
RDW: 12.7 % (ref 11.5–15.5)

## 2012-12-17 MED ORDER — IOHEXOL 300 MG/ML  SOLN
100.0000 mL | Freq: Once | INTRAMUSCULAR | Status: AC | PRN
  Administered 2012-12-17: 100 mL via INTRAVENOUS

## 2012-12-17 MED ORDER — PANTOPRAZOLE SODIUM 40 MG IV SOLR
40.0000 mg | Freq: Once | INTRAVENOUS | Status: AC
  Administered 2012-12-17: 40 mg via INTRAVENOUS
  Filled 2012-12-17: qty 40

## 2012-12-17 MED ORDER — OXYCODONE-ACETAMINOPHEN 5-325 MG PO TABS
1.0000 | ORAL_TABLET | Freq: Once | ORAL | Status: AC
  Administered 2012-12-17: 1 via ORAL
  Filled 2012-12-17: qty 1

## 2012-12-17 MED ORDER — GI COCKTAIL ~~LOC~~
30.0000 mL | Freq: Once | ORAL | Status: AC
  Administered 2012-12-17: 30 mL via ORAL
  Filled 2012-12-17: qty 30

## 2012-12-17 MED ORDER — ONDANSETRON HCL 4 MG/2ML IJ SOLN
4.0000 mg | Freq: Once | INTRAMUSCULAR | Status: AC
  Administered 2012-12-17: 4 mg via INTRAVENOUS
  Filled 2012-12-17: qty 2

## 2012-12-17 MED ORDER — DIPHENHYDRAMINE HCL 50 MG/ML IJ SOLN
25.0000 mg | Freq: Once | INTRAMUSCULAR | Status: AC
  Administered 2012-12-17: 25 mg via INTRAVENOUS
  Filled 2012-12-17: qty 1

## 2012-12-17 MED ORDER — HYDROMORPHONE HCL PF 1 MG/ML IJ SOLN
1.0000 mg | Freq: Once | INTRAMUSCULAR | Status: AC
  Administered 2012-12-17: 1 mg via INTRAVENOUS
  Filled 2012-12-17: qty 1

## 2012-12-17 MED ORDER — IOHEXOL 300 MG/ML  SOLN
50.0000 mL | Freq: Once | INTRAMUSCULAR | Status: AC | PRN
  Administered 2012-12-17: 50 mL via ORAL

## 2012-12-17 NOTE — ED Provider Notes (Signed)
Care assumed from Dr. Micheline Maze.  CT negative for acute process.  Repeat abdominal exam showed epigastric TTP without r/r/g.  Advised continued outpatient follow up.  Clinical Impression: 1. Epigastric pain       Candyce Churn, MD 12/17/12 [phone]

## 2012-12-17 NOTE — ED Notes (Signed)
Pt reports today with epigastric pain that has been occuring for the past six weeks that radiates towards her back. Pt states she has been seen by the [facility] several times and the pt states the provider there stated she needed an endoscopy, however the patient does not have insurance. Pt reports nausea, emesis, and diarrhea for six weeks. Pt states she has lost 15 pounds. Pt states she has been taking carafate, Prilosec, Mylanta, percocet which helps however the relief wears off and the pain returns. Pt states she is concerned about an ongoing problem that the pain medication is covering up.

## 2012-12-17 NOTE — ED Provider Notes (Signed)
CSN: [id number]     Arrival date & time 12/17/12  1239 History   First MD Initiated Contact with Patient 12/17/12 1326     Chief Complaint  Patient presents with  . Abdominal Pain   (Consider location/radiation/quality/duration/timing/severity/associated sxs/prior Treatment) Patient is a 47 y.o. female presenting with abdominal pain. The history is provided by the patient. No language interpreter was used.  Abdominal Pain Pain location:  Epigastric Pain quality: sharp and shooting   Pain radiates to:  Back Pain severity:  Severe Onset quality:  Sudden Duration:  8 weeks Timing:  Intermittent Progression:  Worsening Chronicity:  New Context: eating and previous surgery   Context: not alcohol use, not recent illness, not sick contacts and not suspicious food intake   Relieved by:  Nothing Worsened by:  Eating Ineffective treatments:  NSAIDs, OTC medications and antacids Associated symptoms: anorexia, diarrhea, nausea and vomiting   Associated symptoms: no chest pain, no chills, no cough, no dysuria, no fatigue, no fever, no shortness of breath and no sore throat   Risk factors: multiple surgeries     Past Medical History  Diagnosis Date  . Anxiety   . Thyroid disease   . Substance abuse   . Pneumonia   . Bronchitis   . Kidney stone   . Endometriosis    Past Surgical History  Procedure Laterality Date  . Abdominal hysterectomy    . Cholecystectomy    . Appendectomy    . Ex laporotomy     Family History  Problem Relation Age of Onset  . Thyroid disease Mother   . Thyroid disease Sister    History  Substance Use Topics  . Smoking status: Current Every Day Smoker -- 0.50 packs/day  . Smokeless tobacco: Never Used  . Alcohol Use: No     Comment: socially   OB History   Grav Para Term Preterm Abortions TAB SAB Ect Mult Living                 Review of Systems  Constitutional: Negative for fever, chills, diaphoresis, activity change, appetite change and  fatigue.  HENT: Negative for congestion, facial swelling, rhinorrhea and sore throat.   Eyes: Negative for photophobia and discharge.  Respiratory: Negative for cough, chest tightness and shortness of breath.   Cardiovascular: Negative for chest pain, palpitations and leg swelling.  Gastrointestinal: Positive for nausea, vomiting, abdominal pain, diarrhea and anorexia.  Endocrine: Negative for polydipsia and polyuria.  Genitourinary: Negative for dysuria, frequency, difficulty urinating and pelvic pain.  Musculoskeletal: Negative for arthralgias, back pain, neck pain and neck stiffness.  Skin: Negative for color change and wound.  Allergic/Immunologic: Negative for immunocompromised state.  Neurological: Negative for facial asymmetry, weakness, numbness and headaches.  Hematological: Does not bruise/bleed easily.  Psychiatric/Behavioral: Negative for confusion and agitation.    Allergies  Sulfa antibiotics; Latex; Reglan; Zofran; Clindamycin/lincomycin; Morphine and related; and Penicillins  Home Medications   Current Outpatient Rx  Name  Route  Sig  Dispense  Refill  . aluminum & magnesium hydroxide-simethicone (MYLANTA) 500-450-40 MG/5ML suspension   Oral   Take 15 mLs by mouth every 6 (six) hours as needed for indigestion.   355 mL   1   . diazepam (VALIUM) 5 MG tablet   Oral   Take 1 tablet (5 mg total) by mouth every 12 (twelve) hours as needed.   30 tablet   0   . Multiple Vitamin (MULTIVITAMIN WITH MINERALS) TABS   Oral   Take 1  tablet by mouth every evening.          Marland Kitchen omeprazole (PRILOSEC) 40 MG capsule   Oral   Take 1 capsule (40 mg total) by mouth 2 (two) times daily.   60 capsule   3   . promethazine (PHENERGAN) 25 MG tablet   Oral   Take 25 mg by mouth every 6 (six) hours as needed for nausea or vomiting.         . sucralfate (CARAFATE) 1 G tablet   Oral   Take 1 tablet (1 g total) by mouth 4 (four) times daily -  with meals and at bedtime.   30  tablet   6    BP 140/95  Pulse 110  Temp(Src) 98.1 F (36.7 C) (Oral)  Resp 16  SpO2 100% Physical Exam  Constitutional: She is oriented to person, place, and time. She appears well-developed and well-nourished. No distress.  HENT:  Head: Normocephalic and atraumatic.  Mouth/Throat: No oropharyngeal exudate.  Eyes: Pupils are equal, round, and reactive to light.  Neck: Normal range of motion. Neck supple.  Cardiovascular: Normal rate, regular rhythm and normal heart sounds.  Exam reveals no gallop and no friction rub.   No murmur heard. Pulmonary/Chest: Effort normal and breath sounds normal. No respiratory distress. She has no wheezes. She has no rales.  Abdominal: Soft. Bowel sounds are normal. She exhibits no distension and no mass. There is tenderness in the epigastric area. There is no rigidity, no rebound and no guarding.  Musculoskeletal: Normal range of motion. She exhibits no edema and no tenderness.  Neurological: She is alert and oriented to person, place, and time.  Skin: Skin is warm and dry.  Psychiatric: She has a normal mood and affect.    ED Course  Procedures (including critical care time) Labs Review Labs Reviewed  COMPREHENSIVE METABOLIC PANEL - Abnormal; Notable for the following:    AST 50 (*)    Alkaline Phosphatase 134 (*)    Total Bilirubin 0.2 (*)    All other components within normal limits  LIPASE, BLOOD  CBC WITH DIFFERENTIAL  CBC WITH DIFFERENTIAL   Imaging Review No results found.  EKG Interpretation   None       MDM   1. Epigastric pain    Pt is a 47 y.o. female with Pmhx as above who presents with 6-8 weeks of sharp, stabbing postprandial pain with assoc n/v/d, 5 lb wt loss.  Pt reports having negative AAS, CT, labs work and nml H pylori.  Scheduled to see GI Dec 1st, but symptoms worsening.  On PE, Pt mildy tachycardic but in NAD.  +epigastric ttp w/o rebound or guarding. CBC unremarkable.  Mild AST elevation, similar alk phos  elevation.  Lipase nml. CT ab/pevlis ordered given continued symptoms w/ wt loss.  If negative, feels she is safe for d/c, close PCP f/u and outpt GI f/u for endoscopy.  Care transferred to Dr. Loretha Stapler.         Shanna Cisco, MD 12/17/12 [phone]

## 2012-12-17 NOTE — ED Notes (Signed)
Pt called to get results of CT scan.  I read her the impression stating that I am not authorized to interpret CT scan results.  Pt is concerned that the MD told her that her gallbladder looked fine.  I told pt to come back to ED and speak with Charge RN to get to speak with the MD and get results of CT and to find out what he was talking about.

## 2012-12-31 ENCOUNTER — Emergency Department (HOSPITAL_COMMUNITY): Payer: Self-pay

## 2012-12-31 ENCOUNTER — Encounter (HOSPITAL_COMMUNITY): Payer: Self-pay | Admitting: Emergency Medicine

## 2012-12-31 ENCOUNTER — Emergency Department (HOSPITAL_COMMUNITY)
Admission: EM | Admit: 2012-12-31 | Discharge: 2012-12-31 | Disposition: A | Discharge status: Home / Self Care | Payer: Self-pay | Attending: Emergency Medicine | Admitting: Emergency Medicine

## 2012-12-31 DIAGNOSIS — Y929 Unspecified place or not applicable: Secondary | ICD-10-CM | POA: Insufficient documentation

## 2012-12-31 DIAGNOSIS — Z862 Personal history of diseases of the blood and blood-forming organs and certain disorders involving the immune mechanism: Secondary | ICD-10-CM | POA: Insufficient documentation

## 2012-12-31 DIAGNOSIS — Z880 Allergy status to penicillin: Secondary | ICD-10-CM | POA: Insufficient documentation

## 2012-12-31 DIAGNOSIS — M549 Dorsalgia, unspecified: Secondary | ICD-10-CM

## 2012-12-31 DIAGNOSIS — Z79899 Other long term (current) drug therapy: Secondary | ICD-10-CM | POA: Insufficient documentation

## 2012-12-31 DIAGNOSIS — F411 Generalized anxiety disorder: Secondary | ICD-10-CM | POA: Insufficient documentation

## 2012-12-31 DIAGNOSIS — Z91040 Latex allergy status: Secondary | ICD-10-CM | POA: Insufficient documentation

## 2012-12-31 DIAGNOSIS — M62830 Muscle spasm of back: Secondary | ICD-10-CM

## 2012-12-31 DIAGNOSIS — Z8742 Personal history of other diseases of the female genital tract: Secondary | ICD-10-CM | POA: Insufficient documentation

## 2012-12-31 DIAGNOSIS — Z8701 Personal history of pneumonia (recurrent): Secondary | ICD-10-CM | POA: Insufficient documentation

## 2012-12-31 DIAGNOSIS — Z87442 Personal history of urinary calculi: Secondary | ICD-10-CM | POA: Insufficient documentation

## 2012-12-31 DIAGNOSIS — Y939 Activity, unspecified: Secondary | ICD-10-CM | POA: Insufficient documentation

## 2012-12-31 DIAGNOSIS — W010XXA Fall on same level from slipping, tripping and stumbling without subsequent striking against object, initial encounter: Secondary | ICD-10-CM | POA: Insufficient documentation

## 2012-12-31 DIAGNOSIS — F17200 Nicotine dependence, unspecified, uncomplicated: Secondary | ICD-10-CM | POA: Insufficient documentation

## 2012-12-31 DIAGNOSIS — Z8639 Personal history of other endocrine, nutritional and metabolic disease: Secondary | ICD-10-CM | POA: Insufficient documentation

## 2012-12-31 DIAGNOSIS — IMO0002 Reserved for concepts with insufficient information to code with codable children: Secondary | ICD-10-CM | POA: Insufficient documentation

## 2012-12-31 MED ORDER — HYDROCODONE-ACETAMINOPHEN 5-325 MG PO TABS: ORAL_TABLET | ORAL | Status: DC

## 2012-12-31 MED ORDER — METHOCARBAMOL 500 MG PO TABS: 1000.0000 mg | ORAL_TABLET | Freq: Four times a day (QID) | ORAL | Status: DC

## 2012-12-31 MED ORDER — OXYCODONE-ACETAMINOPHEN 5-325 MG PO TABS
1.0000 | ORAL_TABLET | Freq: Once | ORAL | Status: AC
  Administered 2012-12-31: 1 via ORAL
  Filled 2012-12-31: qty 1

## 2012-12-31 NOTE — ED Notes (Signed)
Pt reports she fell 2 days ago, fell backwards, landed on left hip mostly. Denies hitting head or LOC. Pt having difficulty ambulating. Reports pain radiates down left leg, numbness and tingling in left leg as well. Pain 7/10

## 2012-12-31 NOTE — ED Provider Notes (Signed)
Medical screening examination/treatment/procedure(s) were performed by non-physician practitioner and as supervising physician I was immediately available for consultation/collaboration.  EKG Interpretation   None         Layla Maw Hanaan Gancarz, DO 12/31/12 2144

## 2012-12-31 NOTE — ED Notes (Signed)
Pt transported to XRAY °

## 2012-12-31 NOTE — ED Provider Notes (Signed)
CSN: [id number]     Arrival date & time 12/31/12  1535 History   First MD Initiated Contact with Patient 12/31/12 1546     Chief Complaint  Patient presents with  . Back Pain   (Consider location/radiation/quality/duration/timing/severity/associated sxs/prior Treatment) HPI Comments: Patient with h/o substance abuse -- presents with c/o lower back and hip pain with radiation into L leg beginning acutely after a fall two days ago. Patient states that she slipped on a wet floor, landing on L lower back and hip. She has been using a heating pad since that time and tylenol and took 1 '10mg  vicodin' both which helped. She also took some Aleve. She denies red flag s/s of low back pain. [place] substance reporting database shows multiple narcotics prescriptions over past 6 months. Most recent from ED on 12/13/12 #6 oxycodone. The onset of this condition was acute.  Patient is a 47 y.o. female presenting with back pain. The history is provided by the patient and medical records.  Back Pain Associated symptoms: no dysuria, no fever, no numbness, no pelvic pain and no weakness     Past Medical History  Diagnosis Date  . Anxiety   . Thyroid disease   . Substance abuse   . Pneumonia   . Bronchitis   . Kidney stone   . Endometriosis    Past Surgical History  Procedure Laterality Date  . Abdominal hysterectomy    . Cholecystectomy    . Appendectomy    . Ex laporotomy     Family History  Problem Relation Age of Onset  . Thyroid disease Mother   . Thyroid disease Sister    History  Substance Use Topics  . Smoking status: Current Every Day Smoker -- 0.50 packs/day  . Smokeless tobacco: Never Used  . Alcohol Use: No     Comment: socially   OB History   Grav Para Term Preterm Abortions TAB SAB Ect Mult Living                 Review of Systems  Constitutional: Negative for fever and unexpected weight change.  Gastrointestinal: Negative for constipation.       Negative for fecal  incontinence.   Genitourinary: Negative for dysuria, hematuria, flank pain, vaginal bleeding, vaginal discharge and pelvic pain.       Negative for urinary incontinence or retention.  Musculoskeletal: Positive for arthralgias and back pain. Negative for myalgias.  Neurological: Negative for weakness and numbness.       Denies saddle paresthesias.    Allergies  Sulfa antibiotics; Latex; Reglan; Zofran; Clindamycin/lincomycin; Morphine and related; and Penicillins  Home Medications   Current Outpatient Rx  Name  Route  Sig  Dispense  Refill  . aluminum & magnesium hydroxide-simethicone (MYLANTA) 500-450-40 MG/5ML suspension   Oral   Take 15 mLs by mouth every 6 (six) hours as needed for indigestion.   355 mL   1   . diazepam (VALIUM) 5 MG tablet   Oral   Take 5 mg by mouth every 6 (six) hours as needed for anxiety or muscle spasms.         Marland Kitchen HYDROcodone-acetaminophen (NORCO) 10-325 MG per tablet   Oral   Take 1 tablet by mouth every 6 (six) hours as needed for moderate pain or severe pain.         . Multiple Vitamin (MULTIVITAMIN WITH MINERALS) TABS   Oral   Take 1 tablet by mouth every evening.  BP 140/89  Pulse 129  Temp(Src) 98.2 F (36.8 C) (Oral)  Resp 16  SpO2 100% Physical Exam  Nursing note and vitals reviewed. Constitutional: She appears well-developed and well-nourished.  HENT:  Head: Normocephalic and atraumatic.  Eyes: Conjunctivae are normal.  Neck: Normal range of motion. Neck supple.  Cardiovascular:  Pulses:      Dorsalis pedis pulses are 2+ on the right side, and 2+ on the left side.       Posterior tibial pulses are 2+ on the right side, and 2+ on the left side.  Pulmonary/Chest: Effort normal.  Abdominal: Soft. There is no tenderness. There is no CVA tenderness.  Musculoskeletal: Normal range of motion.       Left hip: She exhibits normal range of motion, normal strength and no tenderness.       Left knee: Normal.       Lumbar  back: She exhibits tenderness and spasm. She exhibits normal range of motion and no bony tenderness.       Back:       Left upper leg: Normal.       Left lower leg: Normal.  No step-off noted with palpation of spine.   Neurological: She is alert. She has normal strength and normal reflexes. No sensory deficit.  5/5 strength in entire lower extremities bilaterally. No sensation deficit.   Skin: Skin is warm and dry. No rash noted.  Psychiatric: She has a normal mood and affect.    ED Course  Procedures (including critical care time) Labs Review Labs Reviewed - No data to display Imaging Review Dg Lumbar Spine Complete  12/31/2012   CLINICAL DATA:  Follow-up low back and left hip pain.  EXAM: LUMBAR SPINE - COMPLETE 4+ VIEW  COMPARISON:  CT 12/17/2012  FINDINGS: Maintenance of vertebral body height and alignment. Mild lower thoracic spondylosis. Intervertebral disc heights are maintained. Cholecystectomy clips.  IMPRESSION: No acute osseous abnormality.   Electronically Signed   By: Jeronimo Greaves M.D.   On: 12/31/2012 17:07   Dg Hip Complete Left  12/31/2012   CLINICAL DATA:  Fall.  EXAM: LEFT HIP - COMPLETE 2+ VIEW  COMPARISON:  CT 04/19/2008.  FINDINGS: Surgical clips right lower pelvis. No acute bony or joint abnormality. No evidence of fracture.  IMPRESSION: No acute abnormality.   Electronically Signed   By: Maisie Fus  Register   On: 12/31/2012 16:58    EKG Interpretation   None      4:05 PM Patient seen and examined. Work-up initiated. Medications ordered.   Vital signs reviewed and are as follows: Filed Vitals:   12/31/12 1542  BP: 140/89  Pulse: 129  Temp: 98.2 F (36.8 C)  Resp: 16   5:26 PM X-rays reviewed by myself.   Pt informed of results. Discussed with patient her narcotics abuse history. Patient states that she now takes her medications as prescribed and has not been abusing narcotics. Will write for #8 Vicodin and robaxin.   No red flag s/s of low back pain.  Patient was counseled on back pain precautions and told to do activity as tolerated but do not lift, push, or pull heavy objects more than 10 pounds for the next week.  Patient counseled to use ice or heat on back for no longer than 15 minutes every hour.   Patient prescribed muscle relaxer and counseled on proper use of muscle relaxant medication.    Patient prescribed narcotic pain medicine and counseled on proper use of narcotic pain  medications. Counseled not to combine this medication with others containing tylenol.   Urged patient not to drink alcohol, drive, or perform any other activities that requires focus while taking either of these medications.  Patient urged to follow-up with PCP if pain does not improve with treatment and rest or if pain becomes recurrent. Urged to return with worsening severe pain, loss of bowel or bladder control, trouble walking.   The patient verbalizes understanding and agrees with the plan.    MDM   1. Back pain   2. Lumbar paraspinal muscle spasm    Patient with back/hip pain. Imaging performed due to trauma, difficulty talking. No neurological deficits. Patient is ambulatory. No warning symptoms of back pain including: loss of bowel or bladder control, night sweats, waking from sleep with back pain, unexplained fevers or weight loss, h/o cancer, IVDU, recent trauma. No concern for cauda equina, epidural abscess, or other serious cause of back pain. Conservative measures such as rest, ice/heat and pain medicine indicated with PCP follow-up if no improvement with conservative management.      Renne Crigler, PA-C 12/31/12 1729

## 2013-01-10 ENCOUNTER — Ambulatory Visit: Payer: Self-pay

## 2013-01-19 ENCOUNTER — Ambulatory Visit: Payer: Self-pay

## 2013-02-28 ENCOUNTER — Encounter (HOSPITAL_COMMUNITY): Payer: Self-pay | Admitting: Emergency Medicine

## 2013-02-28 ENCOUNTER — Emergency Department (HOSPITAL_COMMUNITY)
Admission: EM | Admit: 2013-02-28 | Discharge: 2013-03-01 | Disposition: A | Discharge status: Home / Self Care | Payer: PRIVATE HEALTH INSURANCE | Attending: Emergency Medicine | Admitting: Emergency Medicine

## 2013-02-28 DIAGNOSIS — F411 Generalized anxiety disorder: Secondary | ICD-10-CM | POA: Insufficient documentation

## 2013-02-28 DIAGNOSIS — G8918 Other acute postprocedural pain: Secondary | ICD-10-CM | POA: Insufficient documentation

## 2013-02-28 DIAGNOSIS — Z880 Allergy status to penicillin: Secondary | ICD-10-CM | POA: Insufficient documentation

## 2013-02-28 DIAGNOSIS — Z8701 Personal history of pneumonia (recurrent): Secondary | ICD-10-CM | POA: Insufficient documentation

## 2013-02-28 DIAGNOSIS — Z8709 Personal history of other diseases of the respiratory system: Secondary | ICD-10-CM | POA: Insufficient documentation

## 2013-02-28 DIAGNOSIS — Z862 Personal history of diseases of the blood and blood-forming organs and certain disorders involving the immune mechanism: Secondary | ICD-10-CM | POA: Insufficient documentation

## 2013-02-28 DIAGNOSIS — Z87442 Personal history of urinary calculi: Secondary | ICD-10-CM | POA: Insufficient documentation

## 2013-02-28 DIAGNOSIS — K006 Disturbances in tooth eruption: Secondary | ICD-10-CM | POA: Insufficient documentation

## 2013-02-28 DIAGNOSIS — Z91040 Latex allergy status: Secondary | ICD-10-CM | POA: Insufficient documentation

## 2013-02-28 DIAGNOSIS — IMO0002 Reserved for concepts with insufficient information to code with codable children: Secondary | ICD-10-CM | POA: Insufficient documentation

## 2013-02-28 DIAGNOSIS — K0889 Other specified disorders of teeth and supporting structures: Secondary | ICD-10-CM

## 2013-02-28 DIAGNOSIS — Z8742 Personal history of other diseases of the female genital tract: Secondary | ICD-10-CM | POA: Insufficient documentation

## 2013-02-28 DIAGNOSIS — Z79899 Other long term (current) drug therapy: Secondary | ICD-10-CM | POA: Insufficient documentation

## 2013-02-28 DIAGNOSIS — K08109 Complete loss of teeth, unspecified cause, unspecified class: Secondary | ICD-10-CM | POA: Insufficient documentation

## 2013-02-28 DIAGNOSIS — K089 Disorder of teeth and supporting structures, unspecified: Secondary | ICD-10-CM | POA: Insufficient documentation

## 2013-02-28 DIAGNOSIS — Z792 Long term (current) use of antibiotics: Secondary | ICD-10-CM | POA: Insufficient documentation

## 2013-02-28 DIAGNOSIS — F17200 Nicotine dependence, unspecified, uncomplicated: Secondary | ICD-10-CM | POA: Insufficient documentation

## 2013-02-28 DIAGNOSIS — Z8639 Personal history of other endocrine, nutritional and metabolic disease: Secondary | ICD-10-CM | POA: Insufficient documentation

## 2013-02-28 NOTE — ED Notes (Signed)
Pt c/o pain to R side of jaw. Pt c/o pain post extraction and bone grafting. Pt states Tramadol is not working. Pt states she is unable to eat d/t pain and sutures in mouth. Pt also c/o dizziness onset this am.

## 2013-03-01 MED ORDER — OXYCODONE-ACETAMINOPHEN 5-325 MG PO TABS
2.0000 | ORAL_TABLET | Freq: Once | ORAL | Status: AC
  Administered 2013-03-01: 2 via ORAL
  Filled 2013-03-01: qty 2

## 2013-03-01 MED ORDER — OXYCODONE-ACETAMINOPHEN 10-325 MG PO TABS: 1.0000 | ORAL_TABLET | Freq: Four times a day (QID) | ORAL | Status: DC | PRN

## 2013-03-01 NOTE — ED Provider Notes (Signed)
Medical screening examination/treatment/procedure(s) were performed by non-physician practitioner and as supervising physician I was immediately available for consultation/collaboration.    Olivia Mackielga M Crystale Giannattasio, MD 03/01/13 [phone]

## 2013-03-01 NOTE — Discharge Instructions (Signed)
Continue taking your clindamycin as prescribed. Also recommend warm saline rinses every 4 hours. Take 600 mg ibuprofen every 6 hours for pain control. You may take Percocet as prescribed for breakthrough pain control. Followup with your oral surgeon by end of the day on Friday, 03/02/2013.  Dental Pain A tooth ache may be caused by cavities (tooth decay). Cavities expose the nerve of the tooth to air and hot or cold temperatures. It may come from an infection or abscess (also called a boil or furuncle) around your tooth. It is also often caused by dental caries (tooth decay). This causes the pain you are having. DIAGNOSIS  Your caregiver can diagnose this problem by exam. TREATMENT   If caused by an infection, it may be treated with medications which kill germs (antibiotics) and pain medications as prescribed by your caregiver. Take medications as directed.  Only take over-the-counter or prescription medicines for pain, discomfort, or fever as directed by your caregiver.  Whether the tooth ache today is caused by infection or dental disease, you should see your dentist as soon as possible for further care. SEEK MEDICAL CARE IF: The exam and treatment you received today has been provided on an emergency basis only. This is not a substitute for complete medical or dental care. If your problem worsens or new problems (symptoms) appear, and you are unable to meet with your dentist, call or return to this location. SEEK IMMEDIATE MEDICAL CARE IF:   You have a fever.  You develop redness and swelling of your face, jaw, or neck.  You are unable to open your mouth.  You have severe pain uncontrolled by pain medicine. MAKE SURE YOU:   Understand these instructions.  Will watch your condition.  Will get help right away if you are not doing well or get worse. Document Released: 01/18/2005 Document Revised: 04/12/2011 Document Reviewed: 09/06/2007 [facility] Patient Information 2014 [employer]  [place].

## 2013-03-01 NOTE — ED Provider Notes (Signed)
CSN: [id number]     Arrival date & time 02/28/13  2202 History   First MD Initiated Contact with Patient 03/01/13 0123     Chief Complaint  Patient presents with  . Dental Pain   (Consider location/radiation/quality/duration/timing/severity/associated sxs/prior Treatment) HPI Comments: Patient is a 48 year old female who presents to the emergency department for 5 days of right lower jaw pain. Patient states that pain has been persistent since she had multiple teeth extracted by her oral surgeon. Patient states that pain has been previously controlled with Percocet. She states that she called her oral surgeon for a refill of this medication, but he only gave her tramadol. Patient states that Tylenol is not helping her pain. She has also tried ibuprofen without relief. Patient states the pain is sharp in nature and nonradiating. She states that eating solid foods makes the pain worse. She's been taking clindamycin prophylactically. She denies associated fever, oral bleeding or lesions, inability to swallow or drooling, neck pain or stiffness, shortness of breath, and ear swelling , tenderness, or drainage.  Oral surgeon-Dr. Manson Passey.  The history is provided by the patient. No language interpreter was used.    Past Medical History  Diagnosis Date  . Anxiety   . Thyroid disease   . Substance abuse   . Pneumonia   . Bronchitis   . Kidney stone   . Endometriosis    Past Surgical History  Procedure Laterality Date  . Abdominal hysterectomy    . Cholecystectomy    . Appendectomy    . Ex laporotomy     Family History  Problem Relation Age of Onset  . Thyroid disease Mother   . Thyroid disease Sister    History  Substance Use Topics  . Smoking status: Current Every Day Smoker -- 0.50 packs/day  . Smokeless tobacco: Never Used  . Alcohol Use: No     Comment: socially   OB History   Grav Para Term Preterm Abortions TAB SAB Ect Mult Living                 Review of Systems    Constitutional: Negative for fever.  HENT: Positive for dental problem. Negative for drooling, ear discharge, ear pain, facial swelling, mouth sores, sore throat and trouble swallowing.   Respiratory: Negative for shortness of breath.   Gastrointestinal: Negative for vomiting.  Musculoskeletal: Negative for neck pain and neck stiffness.  All other systems reviewed and are negative.    Allergies  Sulfa antibiotics; Latex; Reglan; Zofran; Clindamycin/lincomycin; Morphine and related; and Penicillins  Home Medications   Current Outpatient Rx  Name  Route  Sig  Dispense  Refill  . acetaminophen (TYLENOL) 500 MG tablet   Oral   Take 500 mg by mouth every 6 (six) hours as needed.         . chlorhexidine (PERIDEX) 0.12 % solution   Mouth/Throat   Use as directed 15 mLs in the mouth or throat 2 (two) times daily.         . clindamycin (CLEOCIN) 300 MG capsule   Oral   Take 300 mg by mouth 3 (three) times daily.         . diazepam (VALIUM) 5 MG tablet   Oral   Take 10 mg by mouth every 8 (eight) hours as needed for anxiety or muscle spasms.          Marland Kitchen ibuprofen (ADVIL,MOTRIN) 200 MG tablet   Oral   Take 200 mg by mouth every 6 (  six) hours as needed.         . promethazine (PHENERGAN) 25 MG tablet   Oral   Take 25 mg by mouth every 6 (six) hours as needed for nausea or vomiting.         . traMADol (ULTRAM) 50 MG tablet   Oral   Take 50 mg by mouth every 4 (four) hours as needed.         . triamcinolone (KENALOG) 0.1 % paste   Mouth/Throat   Use as directed 1 application in the mouth or throat 2 (two) times daily.         Marland Kitchen. oxyCODONE-acetaminophen (PERCOCET) 10-325 MG per tablet   Oral   Take 1 tablet by mouth every 6 (six) hours as needed for pain.   7 tablet   0    BP 118/72  Pulse 89  Temp(Src) 98.1 F (36.7 C) (Oral)  Resp 18  Ht 5\' 4"  (1.626 m)  Wt 149 lb 6.4 oz (67.767 kg)  BMI 25.63 kg/m2  SpO2 99%   Physical Exam  Nursing note and  vitals reviewed. Constitutional: She is oriented to person, place, and time. She appears well-developed and well-nourished. No distress.  HENT:  Head: Normocephalic and atraumatic.  Right Ear: External ear normal. No mastoid tenderness.  Left Ear: External ear normal. No mastoid tenderness.  Nose: Nose normal.  Mouth/Throat: Uvula is midline, oropharynx is clear and moist and mucous membranes are normal. No oral lesions. No trismus in the jaw. Abnormal dentition. No dental abscesses or uvula swelling.    Patient with multiple missing teeth. Sutures appear intact to right lower gingiva extending to, what appears to be, location of former 1st premolar. There is associated gingival swelling and mild erythema which appears to be appropriate given recent oral surgery. No active purulent drainage or weeping. No oral bleeding or lesions. No area of fluctuance. Uvula midline and patient tolerating secretions without difficulty or drooling. Airway patent. No trismus or stridor. Patient speaks in full sentences.  Eyes: Conjunctivae and EOM are normal. Pupils are equal, round, and reactive to light. No scleral icterus.  Neck: Normal range of motion. Neck supple.  Pulmonary/Chest: Effort normal. No stridor. No respiratory distress.  Musculoskeletal: Normal range of motion.  Neurological: She is alert and oriented to person, place, and time.  Skin: Skin is warm and dry. No rash noted. She is not diaphoretic. No erythema. No pallor.  Psychiatric: She has a normal mood and affect. Her behavior is normal.    ED Course  Procedures (including critical care time) Labs Review Labs Reviewed - No data to display Imaging Review No results found.  EKG Interpretation   None       MDM   1. Dentalgia    Uncomplicated dentalgia secondary to recent oral surgery. Patient on prophylactic clindamycin to which she endorses complete compliance. She is well and nontoxic appearing, hemodynamically stable, and  afebrile. No trismus or stridor noted. Uvula midline patient tolerating secretions without difficulty or drooling. Airway patent. No red flags or signs concerning for Ludwig's angina.  Pain treated in the ED with Percocet. Have explained to patient the need for her to followup with her oral surgeon regarding her symptoms. Strongly advised followup within 24 hours. Will prescribe patient short course of Percocet for pain control. I have discussed with her that any further pain management only to be handled by her oral surgeon. She verbalizes understanding. Patient stable for discharge with no unaddressed concerns.  Antony Madura, PA-C 03/01/13 1950

## 2013-03-08 ENCOUNTER — Emergency Department (HOSPITAL_COMMUNITY)
Admission: EM | Admit: 2013-03-08 | Discharge: 2013-03-09 | Disposition: A | Discharge status: Home / Self Care | Payer: PRIVATE HEALTH INSURANCE | Attending: Emergency Medicine | Admitting: Emergency Medicine

## 2013-03-08 ENCOUNTER — Emergency Department (HOSPITAL_COMMUNITY): Payer: PRIVATE HEALTH INSURANCE

## 2013-03-08 ENCOUNTER — Encounter (HOSPITAL_COMMUNITY): Payer: Self-pay | Admitting: Emergency Medicine

## 2013-03-08 DIAGNOSIS — Z87442 Personal history of urinary calculi: Secondary | ICD-10-CM | POA: Insufficient documentation

## 2013-03-08 DIAGNOSIS — K0889 Other specified disorders of teeth and supporting structures: Secondary | ICD-10-CM

## 2013-03-08 DIAGNOSIS — K089 Disorder of teeth and supporting structures, unspecified: Secondary | ICD-10-CM | POA: Insufficient documentation

## 2013-03-08 DIAGNOSIS — R109 Unspecified abdominal pain: Secondary | ICD-10-CM

## 2013-03-08 DIAGNOSIS — R509 Fever, unspecified: Secondary | ICD-10-CM | POA: Insufficient documentation

## 2013-03-08 DIAGNOSIS — F411 Generalized anxiety disorder: Secondary | ICD-10-CM | POA: Insufficient documentation

## 2013-03-08 DIAGNOSIS — Z880 Allergy status to penicillin: Secondary | ICD-10-CM | POA: Insufficient documentation

## 2013-03-08 DIAGNOSIS — Z91040 Latex allergy status: Secondary | ICD-10-CM | POA: Insufficient documentation

## 2013-03-08 DIAGNOSIS — Z8639 Personal history of other endocrine, nutritional and metabolic disease: Secondary | ICD-10-CM | POA: Insufficient documentation

## 2013-03-08 DIAGNOSIS — R197 Diarrhea, unspecified: Secondary | ICD-10-CM | POA: Insufficient documentation

## 2013-03-08 DIAGNOSIS — Z8709 Personal history of other diseases of the respiratory system: Secondary | ICD-10-CM | POA: Insufficient documentation

## 2013-03-08 DIAGNOSIS — R110 Nausea: Secondary | ICD-10-CM | POA: Insufficient documentation

## 2013-03-08 DIAGNOSIS — R1084 Generalized abdominal pain: Secondary | ICD-10-CM | POA: Insufficient documentation

## 2013-03-08 DIAGNOSIS — Z862 Personal history of diseases of the blood and blood-forming organs and certain disorders involving the immune mechanism: Secondary | ICD-10-CM | POA: Insufficient documentation

## 2013-03-08 DIAGNOSIS — Z79899 Other long term (current) drug therapy: Secondary | ICD-10-CM | POA: Insufficient documentation

## 2013-03-08 DIAGNOSIS — F17200 Nicotine dependence, unspecified, uncomplicated: Secondary | ICD-10-CM | POA: Insufficient documentation

## 2013-03-08 DIAGNOSIS — Z8701 Personal history of pneumonia (recurrent): Secondary | ICD-10-CM | POA: Insufficient documentation

## 2013-03-08 DIAGNOSIS — R630 Anorexia: Secondary | ICD-10-CM | POA: Insufficient documentation

## 2013-03-08 DIAGNOSIS — Z8742 Personal history of other diseases of the female genital tract: Secondary | ICD-10-CM | POA: Insufficient documentation

## 2013-03-08 DIAGNOSIS — K625 Hemorrhage of anus and rectum: Secondary | ICD-10-CM | POA: Insufficient documentation

## 2013-03-08 LAB — URINALYSIS, ROUTINE W REFLEX MICROSCOPIC
Bilirubin Urine: NEGATIVE
GLUCOSE, UA: NEGATIVE mg/dL
KETONES UR: NEGATIVE mg/dL
Leukocytes, UA: NEGATIVE
Nitrite: NEGATIVE
PH: 7.5 (ref 5.0–8.0)
Protein, ur: NEGATIVE mg/dL
Specific Gravity, Urine: 1.011 (ref 1.005–1.030)
Urobilinogen, UA: 0.2 mg/dL (ref 0.0–1.0)

## 2013-03-08 LAB — COMPREHENSIVE METABOLIC PANEL
ALBUMIN: 3.9 g/dL (ref 3.5–5.2)
ALT: 8 U/L (ref 0–35)
AST: 10 U/L (ref 0–37)
Alkaline Phosphatase: 141 U/L — ABNORMAL HIGH (ref 39–117)
BUN: 6 mg/dL (ref 6–23)
CHLORIDE: 100 meq/L (ref 96–112)
CO2: 25 meq/L (ref 19–32)
CREATININE: 0.57 mg/dL (ref 0.50–1.10)
Calcium: 9.8 mg/dL (ref 8.4–10.5)
GFR calc Af Amer: >90 mL/min (ref >90)
GFR calc non Af Amer: >90 mL/min (ref >90)
Glucose, Bld: 130 mg/dL — ABNORMAL HIGH (ref 70–99)
Potassium: 3.8 mEq/L (ref 3.7–5.3)
SODIUM: 138 meq/L (ref 137–147)
Total Protein: 7.9 g/dL (ref 6.0–8.3)

## 2013-03-08 LAB — CBC WITH DIFFERENTIAL/PLATELET
BASOS PCT: 0 % (ref 0–1)
Basophils Absolute: 0 10*3/uL (ref 0.0–0.1)
Eosinophils Absolute: 0.1 10*3/uL (ref 0.0–0.7)
Eosinophils Relative: 1 % (ref 0–5)
HCT: 36.8 % (ref 36.0–46.0)
Hemoglobin: 12.9 g/dL (ref 12.0–15.0)
Lymphocytes Relative: 40 % (ref 12–46)
Lymphs Abs: 2.2 10*3/uL (ref 0.7–4.0)
MCH: 31.6 pg (ref 26.0–34.0)
MCHC: 35.1 g/dL (ref 30.0–36.0)
MCV: 90.2 fL (ref 78.0–100.0)
MONO ABS: 0.3 10*3/uL (ref 0.1–1.0)
Monocytes Relative: 5 % (ref 3–12)
NEUTROS ABS: 2.9 10*3/uL (ref 1.7–7.7)
Neutrophils Relative %: 54 % (ref 43–77)
Platelets: 361 10*3/uL (ref 150–400)
RBC: 4.08 MIL/uL (ref 3.87–5.11)
RDW: 12.3 % (ref 11.5–15.5)
WBC: 5.4 10*3/uL (ref 4.0–10.5)

## 2013-03-08 LAB — URINE MICROSCOPIC-ADD ON

## 2013-03-08 LAB — LIPASE, BLOOD: Lipase: 22 U/L (ref 11–59)

## 2013-03-08 MED ORDER — HYDROMORPHONE HCL PF 1 MG/ML IJ SOLN
0.5000 mg | Freq: Once | INTRAMUSCULAR | Status: AC
  Administered 2013-03-08: 0.5 mg via INTRAVENOUS
  Filled 2013-03-08: qty 1

## 2013-03-08 MED ORDER — PROMETHAZINE HCL 25 MG/ML IJ SOLN
25.0000 mg | Freq: Once | INTRAMUSCULAR | Status: AC
Start: 2013-03-08 — End: 2013-03-08
  Administered 2013-03-08: 25 mg via INTRAVENOUS
  Filled 2013-03-08 (×2): qty 1

## 2013-03-08 NOTE — ED Notes (Signed)
Pt states she had dental surgery in Dec and is still having pain uncontrolled by vicodin  Pt states she had 11 teeth extractions and ended up with dry socket  Pt states she is also having blood in her stools, abd distention, belching, and abd pain

## 2013-03-08 NOTE — ED Provider Notes (Signed)
CSN: [id number]     Arrival date & time 03/08/13  1939 History   First MD Initiated Contact with Patient 03/08/13 2049     Chief Complaint  Patient presents with  . Dental Pain  . Rectal Bleeding    HPI  [name] [name] is a 48 y.o. female with a PMH of anxiety, substance abuse, pneumonia, bronchitis, kidney stones, endometriosis, and thyroid disease who presents to the ED for evaluation of dental pain and rectal bleeding.  History was provided by the patient. Patient had had several dental extractions done since December with her last procedure on 02/23/13 with oral surgeon Dr. Manson Passey. She states she has had dental pain in the area of extraction in her right lower jaw since the procedure.  Patient reports swelling and bleeding. She is unsure about drainage of purulent material. She last saw her oral surgeon 4 days ago. She denies any acute worsening of her symptoms but continues to have severe pain. No new injuries/trauma. Patient finished clindamycin, which she has been on since December, two days ago. Since her procedure, patient has been prescribed percocet, tramadol, and vicodin for her pain with temporary relief, however, she has ran out of these medications. Patient seen in the ED on 02/28/13 for dental pain and prescribed Percocet which she completed. She denies any difficulty swallowing/breathing, neck pain/swelling. She has had a mild intermittent fever max temp 101 today. She also has had decreased appetite.  She also reports 1 week hx of intermittent abdominal pain.  She describes a bloating sensation with watery diarrhea.  She also reports dark blood in her stool but denies any bright red blood.  Her pain is diffusely throughout her abdomen with no focal tenderness. She has had mild nausea with no emesis. No dysuria, vaginal bleeding/discharge. Previous surgeries include a abdominal hysterectomy, appendectomy, cholecystectomy, and laparoscopic surgery.  Pain similar to abdominal pain she has had in  the past.     Past Medical History  Diagnosis Date  . Anxiety   . Thyroid disease   . Substance abuse   . Pneumonia   . Bronchitis   . Kidney stone   . Endometriosis    Past Surgical History  Procedure Laterality Date  . Abdominal hysterectomy    . Cholecystectomy    . Appendectomy    . Ex laporotomy     Family History  Problem Relation Age of Onset  . Thyroid disease Mother   . CAD Mother   . Thyroid disease Sister   . CAD Other    History  Substance Use Topics  . Smoking status: Current Every Day Smoker -- 0.50 packs/day  . Smokeless tobacco: Never Used  . Alcohol Use: No     Comment: socially   OB History   Grav Para Term Preterm Abortions TAB SAB Ect Mult Living                 Review of Systems  Constitutional: Positive for fever and appetite change (due to dental pain). Negative for diaphoresis, activity change and fatigue.  HENT: Positive for dental problem. Negative for congestion, rhinorrhea and sore throat.   Respiratory: Negative for cough and shortness of breath.   Cardiovascular: Negative for chest pain and leg swelling.  Gastrointestinal: Positive for nausea, abdominal pain, diarrhea and blood in stool. Negative for vomiting, constipation and anal bleeding.  Genitourinary: Negative for dysuria, decreased urine volume, vaginal bleeding, vaginal discharge, difficulty urinating, vaginal pain and pelvic pain.  Musculoskeletal: Negative for back pain  and myalgias.  Skin: Positive for wound (dental).  Neurological: Negative for dizziness, weakness, light-headedness and headaches.    Allergies  Sulfa antibiotics; Latex; Reglan; Zofran; Clindamycin/lincomycin; Morphine and related; and Penicillins  Home Medications   Current Outpatient Rx  Name  Route  Sig  Dispense  Refill  . acetaminophen (TYLENOL) 500 MG tablet   Oral   Take 500 mg by mouth every 6 (six) hours as needed.         . benzocaine (ORAJEL) 10 % mucosal gel   Mouth/Throat   Use as  directed 1 application in the mouth or throat as needed for mouth pain.         . chlorhexidine (PERIDEX) 0.12 % solution   Mouth/Throat   Use as directed 15 mLs in the mouth or throat 2 (two) times daily.         . diazepam (VALIUM) 5 MG tablet   Oral   Take 10 mg by mouth every 8 (eight) hours as needed for anxiety or muscle spasms.          Marland Kitchen ibuprofen (ADVIL,MOTRIN) 200 MG tablet   Oral   Take 200 mg by mouth every 6 (six) hours as needed for moderate pain.          . promethazine (PHENERGAN) 25 MG tablet   Oral   Take 25 mg by mouth every 6 (six) hours as needed for nausea or vomiting.         . traMADol (ULTRAM) 50 MG tablet   Oral   Take 50 mg by mouth every 4 (four) hours as needed for moderate pain.           BP 136/91  Pulse 107  Temp(Src) 97.8 F (36.6 C) (Oral)  Resp 18  SpO2 98%  Filed Vitals:   03/08/13 2013 03/09/13 0054  BP: 136/91 110/67  Pulse: 107 77  Temp: 97.8 F (36.6 C)   TempSrc: Oral   Resp: 18 16  SpO2: 98% 100%    Physical Exam  Nursing note and vitals reviewed. Constitutional: She is oriented to person, place, and time. She appears well-developed and well-nourished. No distress.  HENT:  Head: Normocephalic and atraumatic.  Right Ear: External ear normal.  Left Ear: External ear normal.  Nose: Nose normal.  Mouth/Throat: Oropharynx is clear and moist. No oropharyngeal exudate.    Right lower gingival tissue exposed with mild edema. No purulent drainage or bleeding. No open wounds. No external facial masses or edema. No erythema to the posterior pharynx. Tonsils without edema or exudates. Uvula midline. No trismus. No difficulty controlling secretions.   Eyes: Conjunctivae are normal. Right eye exhibits no discharge. Left eye exhibits no discharge.  Neck: Normal range of motion. Neck supple.  No LAD. No submental fullness  Cardiovascular: Normal rate, regular rhythm, normal heart sounds and intact distal pulses.  Exam  reveals no gallop and no friction rub.   No murmur heard. Pulmonary/Chest: Effort normal and breath sounds normal. No respiratory distress. She has no wheezes. She has no rales. She exhibits no tenderness.  Abdominal: Soft. Bowel sounds are normal. She exhibits no distension and no mass. There is tenderness. There is no rebound and no guarding.  Diffuse mild tenderness throughout with no focal tenderness. Areas initially tender re-palpated with no tenderness. No flank tenderness. No CVA tenderness  Musculoskeletal: Normal range of motion. She exhibits no edema and no tenderness.  Neurological: She is alert and oriented to person, place, and time.  Skin: Skin is warm and dry. She is not diaphoretic.    ED Course  Procedures (including critical care time) Labs Review Labs Reviewed  CBC WITH DIFFERENTIAL  COMPREHENSIVE METABOLIC PANEL  LIPASE, BLOOD  URINALYSIS, ROUTINE W REFLEX MICROSCOPIC  PREGNANCY, URINE   Imaging Review No results found.  EKG Interpretation   None      Results for orders placed during the hospital encounter of 03/08/13  CBC WITH DIFFERENTIAL      Result Value Range   WBC 5.4  4.0 - 10.5 K/uL   RBC 4.08  3.87 - 5.11 MIL/uL   Hemoglobin 12.9  12.0 - 15.0 g/dL   HCT 16.1  09.6 - 04.5 %   MCV 90.2  78.0 - 100.0 fL   MCH 31.6  26.0 - 34.0 pg   MCHC 35.1  30.0 - 36.0 g/dL   RDW 40.9  81.1 - 91.4 %   Platelets 361  150 - 400 K/uL   Neutrophils Relative % 54  43 - 77 %   Neutro Abs 2.9  1.7 - 7.7 K/uL   Lymphocytes Relative 40  12 - 46 %   Lymphs Abs 2.2  0.7 - 4.0 K/uL   Monocytes Relative 5  3 - 12 %   Monocytes Absolute 0.3  0.1 - 1.0 K/uL   Eosinophils Relative 1  0 - 5 %   Eosinophils Absolute 0.1  0.0 - 0.7 K/uL   Basophils Relative 0  0 - 1 %   Basophils Absolute 0.0  0.0 - 0.1 K/uL  COMPREHENSIVE METABOLIC PANEL      Result Value Range   Sodium 138  137 - 147 mEq/L   Potassium 3.8  3.7 - 5.3 mEq/L   Chloride 100  96 - 112 mEq/L   CO2 25  19 -  32 mEq/L   Glucose, Bld 130 (*) 70 - 99 mg/dL   BUN 6  6 - 23 mg/dL   Creatinine, Ser 7.82  0.50 - 1.10 mg/dL   Calcium 9.8  8.4 - 95.6 mg/dL   Total Protein 7.9  6.0 - 8.3 g/dL   Albumin 3.9  3.5 - 5.2 g/dL   AST 10  0 - 37 U/L   ALT 8  0 - 35 U/L   Alkaline Phosphatase 141 (*) 39 - 117 U/L   Total Bilirubin <0.2 (*) 0.3 - 1.2 mg/dL   GFR calc non Af Amer >90  >90 mL/min   GFR calc Af Amer >90  >90 mL/min  LIPASE, BLOOD      Result Value Range   Lipase 22  11 - 59 U/L  URINALYSIS, ROUTINE W REFLEX MICROSCOPIC      Result Value Range   Color, Urine YELLOW  YELLOW   APPearance CLEAR  CLEAR   Specific Gravity, Urine 1.011  1.005 - 1.030   pH 7.5  5.0 - 8.0   Glucose, UA NEGATIVE  NEGATIVE mg/dL   Hgb urine dipstick SMALL (*) NEGATIVE   Bilirubin Urine NEGATIVE  NEGATIVE   Ketones, ur NEGATIVE  NEGATIVE mg/dL   Protein, ur NEGATIVE  NEGATIVE mg/dL   Urobilinogen, UA 0.2  0.0 - 1.0 mg/dL   Nitrite NEGATIVE  NEGATIVE   Leukocytes, UA NEGATIVE  NEGATIVE  URINE MICROSCOPIC-ADD ON      Result Value Range   Squamous Epithelial / LPF RARE  RARE   WBC, UA 0-2  <3 WBC/hpf   RBC / HPF 0-2  <3 RBC/hpf   Bacteria,  UA RARE  RARE  OCCULT BLOOD, POC DEVICE      Result Value Range   Fecal Occult Bld NEGATIVE  NEGATIVE    DG Abd Acute W/Chest (Final result)  Result time: 03/08/13 22:15:55    Final result by Rad Results In Interface (03/08/13 22:15:55)    Narrative:   CLINICAL DATA: Abdominal pain. Bloody stools.  EXAM: ACUTE ABDOMEN SERIES (ABDOMEN 2 VIEW & CHEST 1 VIEW)  COMPARISON: 12/17/2012  FINDINGS: There is no evidence of dilated bowel loops or free intraperitoneal air. No radiopaque calculi or other significant radiographic abnormality is seen. Cholecystectomy changes. Surgical clips in the right pelvis from appendectomy. Heart size and mediastinal contours are within normal limits. Both lungs are clear. Incidental calcified granuloma in the right upper  lung.  IMPRESSION: Negative abdominal radiographs. No acute cardiopulmonary disease.   Electronically Signed By: Tiburcio PeaJonathan Watts M.D. On: 03/08/2013 22:15         MDM   Carolee RotaKaren Pettaway is a 48 y.o. female with a PMH of anxiety, substance abuse, pneumonia, bronchitis, kidney stones, endometriosis, and thyroid disease who presents to the ED for evaluation of dental pain and rectal bleeding.   Rechecks  10:45 PM = Patient complaining of nausea. Can only have phenergan. Allergies to Zofran and Reglan  11:45 PM = Patient sleeping when I entered the room.  Patient complaining of dental and abdominal pain. Allergy to morphine. 0.5 mg dilaudid ordered.  12:30 AM = Rectal exam performed at bedside with RN. No gross blood. Minimal thin brown stool in rectal vault. No visualized external or palpable internal hemorrhoids. Good sphincter tone.  1:00 PM = Pain controlled.  Patient asking for percocet for 1 day and will contact oral surgeon tomorrow.  Repeat abdominal exam benign.     Patient evaluated for continued dental pain after a dental extraction procedure. Patient afebrile and non-toxic. No evidence of infectious process in oral cavity. No leukocytosis. Patient instructed to follow-up with her oral surgeon. Given Percocet prescription (4 tablets) and instructed to call her oral surgeon tomorrow. Patient denies any worsening in her condition, but reports continued uncontrolled pain. Patient also complains of diarrhea, diffuse abdominal pain, and hematochezia. Occult stool negative. H&H stable. Abdominal exam benign. UA negative for UTI. Labs unremarkable. Abdominal and chest x-rays negative for acute process. Patient recently seen in the ED for abdominal pain.  Negative CT (12/2012).  Patient states she was told she needs an EGD but did not follow-up because she does not have insurance. Patient given referral to GI and resources for follow-up. Return precautions, discharge instructions, and follow-up  was discussed with the patient before discharge.    Discharge Medication List as of 03/09/2013  1:04 AM    START taking these medications   Details  oxyCODONE-acetaminophen (PERCOCET/ROXICET) 5-325 MG per tablet Take 1-2 tablets by mouth every 6 (six) hours as needed for severe pain., Starting 03/09/2013, Until Discontinued, Print        Final impressions: 1. Dentalgia   2. Abdominal pain      Greer EeJessica Katlin Ilijah Doucet PA-C          Jillyn LedgerJessica K Sylena Lotter, [place] JerseyPA-C 03/09/13 1252

## 2013-03-08 NOTE — ED Notes (Signed)
ED phlebotomist unable to obtain blood work, lab phlebotomist to attempt.

## 2013-03-08 NOTE — ED Notes (Signed)
Unsuccessful lab draw by this writer. RN made aware. 

## 2013-03-08 NOTE — ED Notes (Signed)
Unable to obtain blood work with IV insertion. Phlebotomist to attempt blood draw.

## 2013-03-09 LAB — OCCULT BLOOD, POC DEVICE: FECAL OCCULT BLD: NEGATIVE

## 2013-03-09 MED ORDER — OXYCODONE-ACETAMINOPHEN 5-325 MG PO TABS: 1.0000 | ORAL_TABLET | Freq: Four times a day (QID) | ORAL | Status: DC | PRN

## 2013-03-09 NOTE — Discharge Instructions (Signed)
Follow-up with your oral surgeon, primary doctor, and GI specialist  Take Percocet for severe pain - Please be careful with this medication.  It can cause drowsiness.  Use caution while driving, operating machinery, drinking alcohol, or any other activities that may impair your physical or mental abilities.   Return to the emergency department if you develop any changing/worsening condition, drainage of pus, fever, inability to swallow, difficulty breathing, neck pain/stiffness, repeated vomiting, blood in your stool/vomit/urine, weakness, passing out, or any other concerns (please read additional information regarding your condition below)     Dental Pain A tooth ache may be caused by cavities (tooth decay). Cavities expose the nerve of the tooth to air and hot or cold temperatures. It may come from an infection or abscess (also called a boil or furuncle) around your tooth. It is also often caused by dental caries (tooth decay). This causes the pain you are having. DIAGNOSIS  Your caregiver can diagnose this problem by exam. TREATMENT   If caused by an infection, it may be treated with medications which kill germs (antibiotics) and pain medications as prescribed by your caregiver. Take medications as directed.  Only take over-the-counter or prescription medicines for pain, discomfort, or fever as directed by your caregiver.  Whether the tooth ache today is caused by infection or dental disease, you should see your dentist as soon as possible for further care. SEEK MEDICAL CARE IF: The exam and treatment you received today has been provided on an emergency basis only. This is not a substitute for complete medical or dental care. If your problem worsens or new problems (symptoms) appear, and you are unable to meet with your dentist, call or return to this location. SEEK IMMEDIATE MEDICAL CARE IF:   You have a fever.  You develop redness and swelling of your face, jaw, or neck.  You are  unable to open your mouth.  You have severe pain uncontrolled by pain medicine. MAKE SURE YOU:   Understand these instructions.  Will watch your condition.  Will get help right away if you are not doing well or get worse. Document Released: 01/18/2005 Document Revised: 04/12/2011 Document Reviewed: 09/06/2007 [facility] Patient Information 2014 [place], [place].   Abdominal Pain, Adult Many things can cause abdominal pain. Usually, abdominal pain is not caused by a disease and will improve without treatment. It can often be observed and treated at home. Your health care provider will do a physical exam and possibly order blood tests and X-rays to help determine the seriousness of your pain. However, in many cases, more time must pass before a clear cause of the pain can be found. Before that point, your health care provider may not know if you need more testing or further treatment. HOME CARE INSTRUCTIONS  Monitor your abdominal pain for any changes. The following actions may help to alleviate any discomfort you are experiencing:  Only take over-the-counter or prescription medicines as directed by your health care provider.  Do not take laxatives unless directed to do so by your health care provider.  Try a clear liquid diet (broth, tea, or water) as directed by your health care provider. Slowly move to a bland diet as tolerated. SEEK MEDICAL CARE IF:  You have unexplained abdominal pain.  You have abdominal pain associated with nausea or diarrhea.  You have pain when you urinate or have a bowel movement.  You experience abdominal pain that wakes you in the night.  You have abdominal pain that is worsened  or improved by eating food.  You have abdominal pain that is worsened with eating fatty foods. SEEK IMMEDIATE MEDICAL CARE IF:   Your pain does not go away within 2 hours.  You have a fever.  You keep throwing up (vomiting).  Your pain is felt only in portions of the  abdomen, such as the right side or the left lower portion of the abdomen.  You pass bloody or black tarry stools. MAKE SURE YOU:  Understand these instructions.   Will watch your condition.   Will get help right away if you are not doing well or get worse.  Document Released: 10/28/2004 Document Revised: 11/08/2012 Document Reviewed: 09/27/2012 [facility] Patient Information 2014 [place], [place].   Emergency Department Resource Guide 1) Find a Doctor and Pay Out of Pocket Although you won't have to find out who is covered by your insurance plan, it is a good idea to ask around and get recommendations. You will then need to call the office and see if the doctor you have chosen will accept you as a new patient and what types of options they offer for patients who are self-pay. Some doctors offer discounts or will set up payment plans for their patients who do not have insurance, but you will need to ask so you aren't surprised when you get to your appointment.  2) Contact Your Local Health Department Not all health departments have doctors that can see patients for sick visits, but many do, so it is worth a call to see if yours does. If you don't know where your local health department is, you can check in your phone book. The CDC also has a tool to help you locate your state's health department, and many state websites also have listings of all of their local health departments.  3) Find a Walk-in Clinic If your illness is not likely to be very severe or complicated, you may want to try a walk in clinic. These are popping up all over the country in pharmacies, drugstores, and shopping centers. They're usually staffed by nurse practitioners or physician assistants that have been trained to treat common illnesses and complaints. They're usually fairly quick and inexpensive. However, if you have serious medical issues or chronic medical problems, these are probably not your best option.  No  Primary Care Doctor: - Call Health Connect at  [phone] - they can help you locate a primary care doctor that  accepts your insurance, provides certain services, etc. - Physician Referral Service- [phone]  Chronic Pain Problems: Organization         Address  Phone   Notes  [facility]  [phone] Patients need to be referred by their primary care doctor.   Medication Assistance: Organization         Address  Phone   Notes  [facility] Medication [facility] [address] [place] [place], [place] [address] [phone] --Must be a resident of [facility] -- Must have NO insurance coverage whatsoever (no Medicaid/ Medicare, etc.) -- The pt. MUST have a primary care doctor that directs their care regularly and follows them in the community   MedAssist  [phone]   [employer]  [phone]    Agencies that provide inexpensive medical care: Organization         Address  Phone   Notes  [facility]  [phone]   [facility]    [phone]  [facility] [address] [place], [place] [address] [phone]   [facility] [address], [place] [phone](336) [phone]   Planned Parenthood    [phone]   [facility]    [phone]   [facility]  [address], [place] Phone:  [phone](336) [phone], Fax:  [phone] Hours of Operation:  9 am - 6 pm, M-F.  Also accepts Medicaid/Medicare and self-pay.  [facility]  [address], [place] Phone: [phone](336) [phone], Fax: [phone]) [phone]. Hours of Operation:  8:30 am - 5:30 pm, M-F.  Also accepts Medicaid and self-pay.  [facility] [place] [address], [place] Phone: [phone](336) [phone]   [facility] [address], [place], [place] [phone], Ext. [phone] Mondays & Thursdays: 7-9 AM.  First 15 patients are seen on  a first come, first serve basis.    Medicaid-accepting [facility] Providers:  Organization         Address  Phone   Notes  [facility] [address], [place] [phone]) [phone] Also accepts self-pay patients.  [facility] [address], [place]  [phone]) [phone]   [facility] [address], [place] [phone](336) [phone]   [facility] [address] [place] [address], [place] [phone]) [phone]   Renaye RakersVeita Bland [address], [place]   [phone]) [phone] Only accepts [place] Access [place] patients after they have their name applied to their card.   Self-Pay (no insurance) in [facility]:  Organization         Address  Phone   Notes  Sickle Cell Patients, [facility] [address], [place] [phone]) [phone]   [facility] [address], [place] [phone]) [phone]   [facility] [place]  [address], [place] [phone]) [phone]   [facility]/Dr. Osei-Bonsu  [address] [place] [address], [place] or [phone], [place] [phone] Phone number for both [place] and [place] locations is the same.  [facility] [address], [place] [phone]   [facility] [place] [address] [place] [address], [place] or [address] [phone](336) [phone]) [phone]   [facility] [address], [place] [phone](336) [phone], phone; [phone](336) [phone], fax Sees patients 1st and 3rd Saturday of every month.  Must not qualify for public or private insurance (i.e. Medicaid, Medicare, [place] Health Choice, Veterans' Benefits)  Household income should be no more than 200% of the poverty level The clinic cannot treat you if you are pregnant or think you are pregnant  Sexually transmitted diseases are not treated at the clinic.    Dental Care: Organization          Address  Phone  Notes  [facility] [address], [place] [phone]) [phone] Accepts children up to age 46 who are enrolled in [place] or [place] Health Choice; pregnant women with a Medicaid card; and children who have applied for Medicaid or [place] Health Choice, but were declined, whose parents can pay a reduced fee at time of service.  [facility] of [facility] [place]  [address], [place] [phone]) [phone] Accepts children up to age 46 who  are enrolled in Medicaid or [place] Health Choice; pregnant women with a Medicaid card; and children who have applied for Medicaid or [place] Health Choice, but were declined, whose parents can pay a reduced fee at time of service.  [place] Adult Dental Access PROGRAM  [place] [phone] Patients are seen by appointment only. Walk-ins are not accepted. [place] will see patients 67 years of age and older. Monday - Tuesday (8am-5pm) Most Wednesdays (8:30-5pm) $30 per visit, cash only  [facility] Adult Dental Access PROGRAM  [address], [facility] [phone] Patients are seen by appointment only. Walk-ins are not accepted. [place] will see patients 31 years of age and older. One Wednesday Evening (Monthly: Volunteer Based).  $30 per visit, cash only  [place]  [phone] for adults; Children under age 25, call Graduate Pediatric Dentistry at [phone]. Children aged 69-14, please call [phone] to request a pediatric application.  Dental services are provided in all areas of dental care including fillings, crowns and bridges, complete and partial dentures, implants, gum treatment, root canals, and extractions. Preventive care is also provided. Treatment is provided to both adults and children. Patients are selected via a lottery and there is often a waiting list.   [facility] [address] [place]  [phone] [email or web address]   [facility] [address] [place], [place] [phone], Ext. [phone] Second and Fourth Thursday of each month, opens at 6:30 AM; Clinic ends at 9 AM.  Patients are seen on a first-come first-served basis, and a limited number are seen during each clinic.   [facility]  [address] Danker [place], [place] [phone]   Eligibility Requirements You must have lived in [place], [place], or [place] counties for at least the last three months.   You cannot be eligible for state or federal sponsored [employer], including [employer], [place], or [employer].   You generally cannot be eligible for healthcare insurance through your employer.    How to apply: Eligibility screenings are held every Tuesday and Wednesday afternoon from 1:00 pm until 4:00 pm. You do not need an appointment for the interview!  [facility] [address] [place], [place]   [place]  [place] Department  [place]  [phone]    Behavioral Health Resources in the Community: Intensive Outpatient Programs Organization         Address  Phone  Notes  [place] [place], [place] [phone]   [facility] [address] [place], [place]   ADS: Alcohol & Drug Svcs [address], [place], [place]   [place] [address]  [place], [place] or [phone]   Substance Abuse Resources Organization         Address  Phone  Notes  Alcohol and Drug Services  [phone]   [place]  [phone]   The [place]   Chinita Pester  [phone]   Residential & Outpatient Substance Abuse Program  [phone]   Psychological  Services Organization         Address  Phone  Notes  [facility] [place]  [place]  [phone]   [place] [address],  [place] [phone] or [phone]    Mobile Crisis Teams Organization         Address  Phone  Notes  [facility], Mobile Crisis Care Unit  [phone]   [facility]  [address] [place], [place]   [place] Levels [address] [place] [phone]    Self-Help/Support Groups Organization         Address  Phone             Notes  [place]. of [place] - variety of support groups  [place] Call for more information  Narcotics Anonymous (NA), Caring [facility] [address], [employer] [place]  2 meetings at this location   Special educational needs teacher         Address  Phone  Notes  [facility]AP Residential [facility] [place],    [place]  [phone]   [facility]  [address], [place] [address]62, [place], [place]   [place], [place] [phone] Admissions: 8am-3pm M-F  Incentives Substance [place] [address]    [place], [place] [phone]   [facility] [address] [place], [place], [place]   [facility] [address],  [place], [place]   Insight Programs - Intensive Outpatient [address], [place], [place]   [facility] ([place].,  [place], [place] [phone] or [phone]   [facility] ([facility]S) [address] [place], [place] Accepts Medicaid  [facility] [address],  [employer] [place] [phone] Substance Abuse/Addiction Treatment   [facility] Organization         Address  Phone  Notes  [facility]  [phone]   Domenic Schwab, PhD [address]lis [address] [place], [place]   [phone] or [phone]   [place], [place] [phone]   [facility] [address], [place], [place] [phone] Insurance/Medicaid/sponsorship through [facility] and Families [address]                                    [place], [place] [phone] [place] [address], [place] [phone]    Dr. Adele Schilder  [phone]   [facility]. 1) [address], [place] 2) [place] 3)  [place] [address], [place] [phone]  [phone]   [place] [phone] or [phone] (After Hours)

## 2013-03-10 NOTE — ED Provider Notes (Signed)
Medical screening examination/treatment/procedure(s) were performed by non-physician practitioner and as supervising physician I was immediately available for consultation/collaboration.  Flint MelterElliott L Kendalynn Wideman, MD 03/10/13 [phone]

## 2013-03-13 ENCOUNTER — Ambulatory Visit: Payer: Self-pay | Admitting: Internal Medicine

## 2013-03-14 ENCOUNTER — Emergency Department (HOSPITAL_COMMUNITY)
Admission: EM | Admit: 2013-03-14 | Discharge: 2013-03-14 | Disposition: A | Discharge status: Home / Self Care | Payer: Self-pay | Attending: Emergency Medicine | Admitting: Emergency Medicine

## 2013-03-14 ENCOUNTER — Encounter (HOSPITAL_COMMUNITY): Payer: Self-pay | Admitting: Emergency Medicine

## 2013-03-14 DIAGNOSIS — Z8639 Personal history of other endocrine, nutritional and metabolic disease: Secondary | ICD-10-CM | POA: Insufficient documentation

## 2013-03-14 DIAGNOSIS — Z880 Allergy status to penicillin: Secondary | ICD-10-CM | POA: Insufficient documentation

## 2013-03-14 DIAGNOSIS — Z8709 Personal history of other diseases of the respiratory system: Secondary | ICD-10-CM | POA: Insufficient documentation

## 2013-03-14 DIAGNOSIS — R1013 Epigastric pain: Secondary | ICD-10-CM | POA: Insufficient documentation

## 2013-03-14 DIAGNOSIS — R109 Unspecified abdominal pain: Secondary | ICD-10-CM

## 2013-03-14 DIAGNOSIS — Z91040 Latex allergy status: Secondary | ICD-10-CM | POA: Insufficient documentation

## 2013-03-14 DIAGNOSIS — Z79899 Other long term (current) drug therapy: Secondary | ICD-10-CM | POA: Insufficient documentation

## 2013-03-14 DIAGNOSIS — R197 Diarrhea, unspecified: Secondary | ICD-10-CM | POA: Insufficient documentation

## 2013-03-14 DIAGNOSIS — Z8742 Personal history of other diseases of the female genital tract: Secondary | ICD-10-CM | POA: Insufficient documentation

## 2013-03-14 DIAGNOSIS — Z8701 Personal history of pneumonia (recurrent): Secondary | ICD-10-CM | POA: Insufficient documentation

## 2013-03-14 DIAGNOSIS — Z87442 Personal history of urinary calculi: Secondary | ICD-10-CM | POA: Insufficient documentation

## 2013-03-14 DIAGNOSIS — F17200 Nicotine dependence, unspecified, uncomplicated: Secondary | ICD-10-CM | POA: Insufficient documentation

## 2013-03-14 DIAGNOSIS — F411 Generalized anxiety disorder: Secondary | ICD-10-CM | POA: Insufficient documentation

## 2013-03-14 DIAGNOSIS — R000 Tachycardia, unspecified: Secondary | ICD-10-CM | POA: Insufficient documentation

## 2013-03-14 DIAGNOSIS — Z862 Personal history of diseases of the blood and blood-forming organs and certain disorders involving the immune mechanism: Secondary | ICD-10-CM | POA: Insufficient documentation

## 2013-03-14 DIAGNOSIS — R1110 Vomiting, unspecified: Secondary | ICD-10-CM | POA: Insufficient documentation

## 2013-03-14 LAB — CBC WITH DIFFERENTIAL/PLATELET
Basophils Absolute: 0 10*3/uL (ref 0.0–0.1)
Basophils Relative: 0 % (ref 0–1)
Eosinophils Absolute: 0.1 10*3/uL (ref 0.0–0.7)
Eosinophils Relative: 1 % (ref 0–5)
HCT: 43.8 % (ref 36.0–46.0)
Hemoglobin: 15.3 g/dL — ABNORMAL HIGH (ref 12.0–15.0)
Lymphocytes Relative: 30 % (ref 12–46)
Lymphs Abs: 1.6 10*3/uL (ref 0.7–4.0)
MCH: 32 pg (ref 26.0–34.0)
MCHC: 34.9 g/dL (ref 30.0–36.0)
MCV: 91.6 fL (ref 78.0–100.0)
Monocytes Absolute: 0.3 10*3/uL (ref 0.1–1.0)
Monocytes Relative: 5 % (ref 3–12)
Neutro Abs: 3.3 10*3/uL (ref 1.7–7.7)
Neutrophils Relative %: 63 % (ref 43–77)
Platelets: 421 10*3/uL — ABNORMAL HIGH (ref 150–400)
RBC: 4.78 MIL/uL (ref 3.87–5.11)
RDW: 12.6 % (ref 11.5–15.5)
WBC: 5.2 10*3/uL (ref 4.0–10.5)

## 2013-03-14 LAB — POCT I-STAT TROPONIN I: Troponin i, poc: 0 ng/mL (ref 0.00–0.08)

## 2013-03-14 LAB — COMPREHENSIVE METABOLIC PANEL
ALT: 8 U/L (ref 0–35)
AST: 15 U/L (ref 0–37)
Albumin: 4.7 g/dL (ref 3.5–5.2)
Alkaline Phosphatase: 144 U/L — ABNORMAL HIGH (ref 39–117)
BUN: 7 mg/dL (ref 6–23)
CO2: 23 mEq/L (ref 19–32)
Calcium: 10.3 mg/dL (ref 8.4–10.5)
Chloride: 98 mEq/L (ref 96–112)
Creatinine, Ser: 0.71 mg/dL (ref 0.50–1.10)
GFR calc Af Amer: >90 mL/min (ref >90)
GFR calc non Af Amer: >90 mL/min (ref >90)
Glucose, Bld: 104 mg/dL — ABNORMAL HIGH (ref 70–99)
Potassium: 4.8 mEq/L (ref 3.7–5.3)
Sodium: 136 mEq/L — ABNORMAL LOW (ref 137–147)
Total Bilirubin: 0.3 mg/dL (ref 0.3–1.2)
Total Protein: 9 g/dL — ABNORMAL HIGH (ref 6.0–8.3)

## 2013-03-14 LAB — LIPASE, BLOOD: Lipase: 30 U/L (ref 11–59)

## 2013-03-14 MED ORDER — PANTOPRAZOLE SODIUM 20 MG PO TBEC: 20.0000 mg | DELAYED_RELEASE_TABLET | Freq: Two times a day (BID) | ORAL | Status: DC

## 2013-03-14 MED ORDER — GI COCKTAIL ~~LOC~~
30.0000 mL | Freq: Once | ORAL | Status: AC
  Administered 2013-03-14: 30 mL via ORAL
  Filled 2013-03-14: qty 30

## 2013-03-14 MED ORDER — HYDROMORPHONE HCL PF 1 MG/ML IJ SOLN
1.0000 mg | Freq: Once | INTRAMUSCULAR | Status: AC
  Administered 2013-03-14: 1 mg via INTRAMUSCULAR
  Filled 2013-03-14: qty 1

## 2013-03-14 MED ORDER — DIPHENHYDRAMINE HCL 25 MG PO CAPS
25.0000 mg | ORAL_CAPSULE | Freq: Once | ORAL | Status: AC
  Administered 2013-03-14: 25 mg via ORAL
  Filled 2013-03-14: qty 1

## 2013-03-14 MED ORDER — PANTOPRAZOLE SODIUM 40 MG PO TBEC
80.0000 mg | DELAYED_RELEASE_TABLET | Freq: Once | ORAL | Status: AC
  Administered 2013-03-14: 80 mg via ORAL
  Filled 2013-03-14: qty 2

## 2013-03-14 NOTE — Progress Notes (Signed)
[facility] CL did not get to see patient but will be sending information about [facility] Orange Card program, using the address provided.  °

## 2013-03-14 NOTE — ED Notes (Signed)
Pt c/o R side abdominal pain, emesis, and diarrhea since September.  Pain score 8/10.  Pt sts "I can't eat.  Every time I eat my abdomen kills me."  [facility].  Sts "they told me that I need to follow up with a GI doctor, but I do not have insurance."  Pt's husband sts "her eyes look a little yellow."  This RN does not notice any discoloration.

## 2013-03-14 NOTE — Discharge Instructions (Signed)
°Emergency Department Resource Guide °1) Find a Doctor and Pay Out of Pocket °Although you won't have to find out who is covered by your insurance plan, it is a good idea to ask around and get recommendations. You will then need to call the office and see if the doctor you have chosen will accept you as a new patient and what types of options they offer for patients who are self-pay. Some doctors offer discounts or will set up payment plans for their patients who do not have insurance, but you will need to ask so you aren't surprised when you get to your appointment. ° °2) Contact Your Local Health Department °Not all health departments have doctors that can see patients for sick visits, but many do, so it is worth a call to see if yours does. If you don't know where your local health department is, you can check in your phone book. The CDC also has a tool to help you locate your state's health department, and many state websites also have listings of all of their local health departments. ° °3) Find a Walk-in Clinic °If your illness is not likely to be very severe or complicated, you may want to try a walk in clinic. These are popping up all over the country in pharmacies, drugstores, and shopping centers. They're usually staffed by nurse practitioners or physician assistants that have been trained to treat common illnesses and complaints. They're usually fairly quick and inexpensive. However, if you have serious medical issues or chronic medical problems, these are probably not your best option. ° °No Primary Care Doctor: °- Call [facility] at  832-8000 - they can help you locate a primary care doctor that  accepts your insurance, provides certain services, etc. °- Physician Referral Service- 1-800-533-3463 ° °Chronic Pain Problems: °Organization         Address  Phone   Notes  °[facility]  (336) 297-2271 Patients need to be referred by their primary care doctor.  ° °Medication  Assistance: °Organization         Address  Phone   Notes  °[place] [facility] [address] °[place], [place] [address] (336) 641-8030 --Must be a resident of [place] °-- Must have NO insurance coverage whatsoever (no Medicaid/ Medicare, etc.) °-- The pt. MUST have a primary care doctor that directs their care regularly and follows them in the community °  °[facility]  (866) 331-1348   °[employer]  (888) 892-1162   ° °Agencies that provide inexpensive medical care: °Organization         Address  Phone   Notes  °[facility]  (336) 832-8035   °[facility]    (336) 832-7272   °[facility] [address] °[place], [place] [address] (336) 832-4777   °[facility] [place] [address], °[place] (336) 271-4999   °[facility]    (336) 373-0678   °[facility]    (336) 272-1050   °[facility] ° [address], [place] Phone:  (336) 832-4444, Fax:  (336) 832-4440 Hours of Operation:  9 am - 6 pm, M-F.  Also accepts Medicaid/Medicare and self-pay.  °[facility] ° [address],  Phone: (336) 832-3150, Fax: (336) 832-3151. Hours of Operation:  8:30 am - 5:30 pm, M-F.  Also accepts Medicaid and self-pay.  °[facility] [place] [address], [place] Phone: (336) 878-6027   °[facility] [address], [place], [place] (336)723-1848, Ext. 123 Mondays & Thursdays: 7-9 AM.  First 15 patients are seen on a first come, first serve basis. °  ° °Medicaid-accepting [place] Providers: ° °Organization         Address  Phone   Notes  °[facility] [address], [place] (336) 641-2100 Also accepts self-pay patients.  °[facility] [address], [place] ° (336) 856-9996   °[facility] [address], [place]  (336) 288-8857   °[facility] [address] [place] [address], [place] (336) 299-7000   °Veita Bland [address], [place]  ° (336) 373-1557 Only accepts [place] Access Medicaid patients after they have their name applied to their card.  ° °Self-Pay (no insurance) in [place]: ° °Organization         Address  Phone   Notes  °Sickle Cell Patients, [facility] [address], [place] (336) 832-1970   °[facility] [address], [place] (336) 832-4400   °[facility] ° [address], [place] (336) 992-4800   °[facility]/Dr. Osei-Bonsu ° [address] [place] [address], [place] or [address], [place] (336) 841-8500 Phone number for both [place] and [place] locations is the same.  °[facility] [address], [place] (336) 299-0000   °[facility] [address] [place] [address], [place] or [address] (336) 852-7530 °(336) 878-2260   °[facility] [address], [place] (336) 350-1642, phone; (336) 294-5005, fax Sees patients 1st and 3rd Saturday of every month.  Must not qualify for public or private insurance (i.e. Medicaid, Medicare, [place] Health Choice, Veterans' Benefits) • Household income should be no more than 200% of the poverty level •The clinic cannot treat you if you are pregnant or think you are pregnant • Sexually transmitted diseases are not treated at the clinic.  ° ° °Dental Care: °Organization         Address  Phone  Notes  °[place] [facility] [address], [place] (336) 641-6152 Accepts children up to age 21 who are enrolled in Medicaid or [place] Health Choice; pregnant women with a Medicaid card; and children who have applied for Medicaid or [place] Health Choice, but were declined, whose parents can pay a reduced fee at time of service.  °[place]  [facility] [place]  [address], [place] (336) 641-7733 Accepts children up to age 21 who are enrolled in Medicaid or [place] Health Choice; pregnant women with a Medicaid card; and children who have applied for Medicaid or [place] Health Choice, but were declined, whose parents can pay a reduced fee at time of service.  °[facility] ° [address], [place] (336) 641-4533 Patients are seen by appointment only. Walk-ins are not accepted. [facility] will see patients 18 years of age and older. °Monday - Tuesday (8am-5pm) °Most Wednesdays (8:30-5pm) °$30 per visit, cash only  °[facility] ° [address], [place] (336) 641-4533 Patients are seen by appointment only. Walk-ins are not accepted. [facility] will see patients 18 years of age and older. °One   Wednesday Evening (Monthly: Volunteer Based).  $30 per visit, cash only  °[facility]  (919) 537-3737 for adults; Children under age 4, call Graduate Pediatric Dentistry at (919) [phone]. Children aged 4-14, please call (919) [phone] to request a pediatric application. ° Dental services are provided in all areas of dental care including fillings, crowns and bridges, complete and partial dentures, implants, gum treatment, root canals, and extractions. Preventive care is also provided. Treatment is provided to both adults and children. °Patients are selected via a lottery and there is often a waiting list. °  °[facility] [address], °[place] ° (336) 763-8833 [email or web address] °  °[facility] [address], [place], [place] (336)723-1848, Ext. 123 Second and Fourth Thursday of each month, opens at 6:30 AM; Clinic ends at 9 AM.  Patients are seen on a first-come first-served basis, and a limited number are seen during each clinic.  ° °[facility] ° [address], [place], [place] (336) 723-7904    Eligibility Requirements °You must have lived in [place], [place], or [place] counties for at least the last three months. °  You cannot be eligible for state or federal sponsored healthcare insurance, including Veterans Administration, Medicaid, or Medicare. °  You generally cannot be eligible for healthcare insurance through your employer.  °  How to apply: °Eligibility screenings are held every Tuesday and Wednesday afternoon from 1:00 pm until 4:00 pm. You do not need an appointment for the interview!  °[facility] [address], [place], [place] 336-631-2330   °[facility]  336-342-8273   °[facility]  336-703-3100   °[facility]  336-570-6415   ° °Behavioral Health Resources in the Community: °Intensive Outpatient Programs °Organization         Address  Phone  Notes  °[facility] [address], [place], [place] 336-878-6098   °[facility] [address], [place], [place] 336-832-9800   °[facility] [address], [place], [place] ° 336-882-2125   °[facility] [address],  °[place], [place] 1-800-853-5163 or 336-641-4981   °Substance Abuse Resources °Organization         Address  Phone  Notes  °[facility]  336-882-2125   °[facility]  336-784-9470   °[facility]  336-285-9073   °[facility]  336-845-3988   °Residential & Outpatient Substance Abuse Program  1-800-659-3381   °Psychological Services °Organization         Address  Phone  Notes  °[facility]  336- 832-9600   °[facility]  336- 378-7881   °[facility] [address], [place] 1-800-853-5163 or 336-641-4981   ° °Mobile Crisis Teams °Organization         Address  Phone  Notes  °[facility], Mobile Crisis Care Unit  1-877-626-1772   °Assertive °[facility] ° [address]  [place], [place] 336-834-9664   °Sharon DeEsch [address] °[place] 336-554-5454   ° °Self-Help/Support Groups °Organization         Address  Phone             Notes  °[facility] - variety of support groups  336- 373-1402 Call for more information  °Narcotics Anonymous (NA), [facility] [address], °[place]   2 meetings at this location  ° °  Residential Treatment Programs °Organization         Address  Phone  Notes  °[facility] [address],    °[place]  1-866-801-8205   °[facility] ° [address], [place], [place] 704-293-8524   °[facility] [address], [place] 336-845-3988 Admissions: 8am-3pm M-F  °[facility] [address]    °[place], [place] 336-841-1104   °[facility] [address], [place], [place] 336-379-7146   °[facility] [address]  °[place], [place] 336-285-9073   °[facility] Intensive Outpatient [address], [place], [place] 336-852-3033   °[facility] [address]  °[place], [place] 1-877-615-2722 or 336-784-9470   °[facility] [address] [place], [place] 336-227-7417 Accepts Medicaid  °[facility] [address]  °[place] 1-800-659-3381 Substance Abuse/Addiction Treatment  ° °[place] Behavioral Health Resources °Organization         Address  Phone  Notes  °[facility]  (888) 581-9988   °Julie Brannon, PhD [address] [place], [place]   (336) 349-5553 or (336) 951-0000   °[facility]   [address] °[place], [place] (336) 349-4454   °[facility] [address], [place], [place] (336) 342-8316 Insurance/Medicaid/sponsorship through [facility]  °[facility] [address]                                    [place], [place] (336) 342-8316 Therapy/tele-psych/case    °[facility] [address]  ° [place], [place] (336) 349-2233    °Dr. Arfeen  (336) 349-4544   °[facility] [place]  [employer] [place] [facility] 1) [address], [place] °2) [address], [place] °3)  [address], [place] (336) 349-3220 °(336) 342-7768 ° °(336) 342-8140   °[place] [facility] (336) 342-1394 or (336) 342-3537 (After Hours)    ° ° °

## 2013-03-14 NOTE — ED Notes (Addendum)
MD at bedside. 

## 2013-03-14 NOTE — ED Provider Notes (Signed)
CSN: [id number]     Arrival date & time 03/14/13  1036 History   First MD Initiated Contact with Patient 03/14/13 1104     Chief Complaint  Patient presents with  . Abdominal Pain     (Consider location/radiation/quality/duration/timing/severity/associated sxs/prior Treatment) HPI  48 year old female with abdominal pain, vomiting and intermittent diarrhea. Symptoms have been ongoing since December. Wax and wane, but does not completely go away. Pain is constant and exacerbated with eating. Pain is worse in couple minutes after eating or drinking anything. Has this is nonbloody. No coffee grounds. No per blood per rectum or melena. No fevers or chills. Patient reports she has been taking Carafate and avoiding NSAIDs. She has been referred to GI but says she cannot followup secondary to financial constraints. No urinary complaints. The blood in her stool or melena. No sick contacts. Has tried taking Percocet and tramadol for pain with only mild relief.  Past Medical History  Diagnosis Date  . Anxiety   . Thyroid disease   . Substance abuse   . Pneumonia   . Bronchitis   . Kidney stone   . Endometriosis    Past Surgical History  Procedure Laterality Date  . Abdominal hysterectomy    . Cholecystectomy    . Appendectomy    . Ex laporotomy     Family History  Problem Relation Age of Onset  . Thyroid disease Mother   . CAD Mother   . Thyroid disease Sister   . CAD Other    History  Substance Use Topics  . Smoking status: Current Every Day Smoker -- 0.50 packs/day  . Smokeless tobacco: Never Used  . Alcohol Use: No     Comment: socially   OB History   Grav Para Term Preterm Abortions TAB SAB Ect Mult Living                 Review of Systems  All systems reviewed and negative, other than as noted in HPI.   Allergies  Sulfa antibiotics; Latex; Reglan; Zofran; Clindamycin/lincomycin; Morphine and related; and Penicillins  Home Medications   Current Outpatient Rx   Name  Route  Sig  Dispense  Refill  . acetaminophen (TYLENOL) 500 MG tablet   Oral   Take 500 mg by mouth every 6 (six) hours as needed.         Marland Kitchen. albuterol (PROVENTIL HFA;VENTOLIN HFA) 108 (90 BASE) MCG/ACT inhaler   Inhalation   Inhale 1 puff into the lungs every 6 (six) hours as needed for wheezing or shortness of breath.         . benzocaine (ORAJEL) 10 % mucosal gel   Mouth/Throat   Use as directed 1 application in the mouth or throat as needed for mouth pain.         . chlorhexidine (PERIDEX) 0.12 % solution   Mouth/Throat   Use as directed 15 mLs in the mouth or throat 2 (two) times daily.         . diazepam (VALIUM) 5 MG tablet   Oral   Take 10 mg by mouth every 8 (eight) hours as needed for anxiety or muscle spasms.          Marland Kitchen. ibuprofen (ADVIL,MOTRIN) 200 MG tablet   Oral   Take 200 mg by mouth every 6 (six) hours as needed for fever or moderate pain.         . traMADol (ULTRAM) 50 MG tablet   Oral   Take 50 mg  by mouth every 4 (four) hours as needed for moderate pain.          . pantoprazole (PROTONIX) 20 MG tablet   Oral   Take 1 tablet (20 mg total) by mouth 2 (two) times daily.   60 tablet   0    BP 130/86  Pulse 117  Temp(Src) 97.8 F (36.6 C) (Oral)  Resp 16  SpO2 100% Physical Exam  Nursing note and vitals reviewed. Constitutional: She appears well-developed and well-nourished. No distress.  HENT:  Head: Normocephalic and atraumatic.  Eyes: Conjunctivae are normal. Right eye exhibits no discharge. Left eye exhibits no discharge.  Neck: Neck supple.  Cardiovascular: Regular rhythm and normal heart sounds.  Exam reveals no gallop and no friction rub.   No murmur heard. Mild tachycardia  Pulmonary/Chest: Effort normal and breath sounds normal. No respiratory distress.  Abdominal: Soft. She exhibits no distension. There is tenderness.  Epigastric tenderness without rebound or guarding. No distention.  Genitourinary:  No CVA  tenderness  Musculoskeletal: She exhibits no edema and no tenderness.  Neurological: She is alert.  Skin: Skin is warm and dry.  Psychiatric: She has a normal mood and affect. Her behavior is normal. Thought content normal.    ED Course  Procedures (including critical care time) Labs Review Labs Reviewed  CBC WITH DIFFERENTIAL - Abnormal; Notable for the following:    Hemoglobin 15.3 (*)    Platelets 421 (*)    All other components within normal limits  COMPREHENSIVE METABOLIC PANEL - Abnormal; Notable for the following:    Sodium 136 (*)    Glucose, Bld 104 (*)    Total Protein 9.0 (*)    Alkaline Phosphatase 144 (*)    All other components within normal limits  LIPASE, BLOOD  POCT I-STAT TROPONIN I   Imaging Review No results found.  EKG Interpretation    Date/Time:  Wednesday March 14 2013 11:07:45 EST Ventricular Rate:  112 PR Interval:  121 QRS Duration: 76 QT Interval:  313 QTC Calculation: 427 R Axis:   77 Text Interpretation:  Sinus tachycardia Right atrial enlargement ED PHYSICIAN INTERPRETATION AVAILABLE IN [facility] Confirmed by TEST, RECORD ([id number]) on 03/16/2013 8:58:03 AM            MDM   Final diagnoses:  Abdominal pain    [name] [name] with abdominal pain. This has been ongoing since September. No acute change. Some tenderness on exam, but not peritoneal. Suspect PUD. Pt already taking carafate. Will add PPI. Encouraged to stop smoking. Already avoids NSAIDs. GI follow-up.   Some concern for drug seeking behavior. Hx of substance abuse, specific medication requests and that be given IV and concerning pattern noted on [place] controlled substance database.  Numerous different addresses and some just slight variations ([address] and [address], [address] and [address]).  Numerous different providers.  03/05/13 - # 30  HYDROCODONE- ACETAMINOPHEN 5- 325 03/04/13 - # 90  DIAZEPAM 10 MG  03/01/13 - #7   OXYCODONE-  ACETAMINOPHEN 10- 325 03/01/13 - #30 OXYCODONE- ACETAMINOPHEN 5- 325  02/27/13 - #30 TRAMADOL HCL 50 MG  02/23/13 - #30 OXYCODONE- ACETAMINOPHEN 10- 325 02/16/12 - #30 OXYCODONE- ACETAMINOPHEN 10- 325 02/12/13 -  #16 OXYCODONE- ACETAMINOPHEN 5- 325 02/09/13 -  #16 OXYCODONE- ACETAMINOPHEN 5- 325 02/05/13 - #30 OXYCODONE- ACETAMINOPHEN 10- 325 02/01/13 - # 90 DIAZEPAM 10 MG  Raeford Razor, MD 03/19/13 [phone]

## 2013-03-16 ENCOUNTER — Ambulatory Visit: Payer: Self-pay | Admitting: Internal Medicine

## 2013-03-24 ENCOUNTER — Emergency Department (HOSPITAL_COMMUNITY)
Admission: EM | Admit: 2013-03-24 | Discharge: 2013-03-24 | Disposition: A | Discharge status: Home / Self Care | Payer: Self-pay | Attending: Emergency Medicine | Admitting: Emergency Medicine

## 2013-03-24 ENCOUNTER — Encounter (HOSPITAL_COMMUNITY): Payer: Self-pay | Admitting: Emergency Medicine

## 2013-03-24 DIAGNOSIS — Z90710 Acquired absence of both cervix and uterus: Secondary | ICD-10-CM | POA: Insufficient documentation

## 2013-03-24 DIAGNOSIS — Z87442 Personal history of urinary calculi: Secondary | ICD-10-CM | POA: Insufficient documentation

## 2013-03-24 DIAGNOSIS — K625 Hemorrhage of anus and rectum: Secondary | ICD-10-CM | POA: Insufficient documentation

## 2013-03-24 DIAGNOSIS — Z862 Personal history of diseases of the blood and blood-forming organs and certain disorders involving the immune mechanism: Secondary | ICD-10-CM | POA: Insufficient documentation

## 2013-03-24 DIAGNOSIS — Z79899 Other long term (current) drug therapy: Secondary | ICD-10-CM | POA: Insufficient documentation

## 2013-03-24 DIAGNOSIS — Z8742 Personal history of other diseases of the female genital tract: Secondary | ICD-10-CM | POA: Insufficient documentation

## 2013-03-24 DIAGNOSIS — Z9089 Acquired absence of other organs: Secondary | ICD-10-CM | POA: Insufficient documentation

## 2013-03-24 DIAGNOSIS — F17200 Nicotine dependence, unspecified, uncomplicated: Secondary | ICD-10-CM | POA: Insufficient documentation

## 2013-03-24 DIAGNOSIS — Z8709 Personal history of other diseases of the respiratory system: Secondary | ICD-10-CM | POA: Insufficient documentation

## 2013-03-24 DIAGNOSIS — F411 Generalized anxiety disorder: Secondary | ICD-10-CM | POA: Insufficient documentation

## 2013-03-24 DIAGNOSIS — Z880 Allergy status to penicillin: Secondary | ICD-10-CM | POA: Insufficient documentation

## 2013-03-24 DIAGNOSIS — R1110 Vomiting, unspecified: Secondary | ICD-10-CM

## 2013-03-24 DIAGNOSIS — Z91040 Latex allergy status: Secondary | ICD-10-CM | POA: Insufficient documentation

## 2013-03-24 DIAGNOSIS — Z8639 Personal history of other endocrine, nutritional and metabolic disease: Secondary | ICD-10-CM | POA: Insufficient documentation

## 2013-03-24 DIAGNOSIS — Z8701 Personal history of pneumonia (recurrent): Secondary | ICD-10-CM | POA: Insufficient documentation

## 2013-03-24 DIAGNOSIS — K921 Melena: Secondary | ICD-10-CM | POA: Insufficient documentation

## 2013-03-24 DIAGNOSIS — K2990 Gastroduodenitis, unspecified, without bleeding: Principal | ICD-10-CM

## 2013-03-24 DIAGNOSIS — K2970 Gastritis, unspecified, without bleeding: Secondary | ICD-10-CM | POA: Insufficient documentation

## 2013-03-24 LAB — COMPREHENSIVE METABOLIC PANEL
ALK PHOS: 104 U/L (ref 39–117)
ALT: 10 U/L (ref 0–35)
AST: 15 U/L (ref 0–37)
Albumin: 4.1 g/dL (ref 3.5–5.2)
BUN: 5 mg/dL — AB (ref 6–23)
CO2: 22 mEq/L (ref 19–32)
Calcium: 9.7 mg/dL (ref 8.4–10.5)
Chloride: 104 mEq/L (ref 96–112)
Creatinine, Ser: 0.67 mg/dL (ref 0.50–1.10)
GFR calc non Af Amer: >90 mL/min (ref >90)
Glucose, Bld: 87 mg/dL (ref 70–99)
POTASSIUM: 3.5 meq/L — AB (ref 3.7–5.3)
Sodium: 142 mEq/L (ref 137–147)
TOTAL PROTEIN: 7.6 g/dL (ref 6.0–8.3)
Total Bilirubin: <0.2 mg/dL — ABNORMAL LOW (ref 0.3–1.2)

## 2013-03-24 LAB — CBC
HEMATOCRIT: 39.1 % (ref 36.0–46.0)
Hemoglobin: 13.6 g/dL (ref 12.0–15.0)
MCH: 31.6 pg (ref 26.0–34.0)
MCHC: 34.8 g/dL (ref 30.0–36.0)
MCV: 90.7 fL (ref 78.0–100.0)
Platelets: 231 10*3/uL (ref 150–400)
RBC: 4.31 MIL/uL (ref 3.87–5.11)
RDW: 12.7 % (ref 11.5–15.5)
WBC: 4.1 10*3/uL (ref 4.0–10.5)

## 2013-03-24 LAB — POC OCCULT BLOOD, ED: Fecal Occult Bld: POSITIVE — AB

## 2013-03-24 LAB — LIPASE, BLOOD: LIPASE: 25 U/L (ref 11–59)

## 2013-03-24 LAB — I-STAT CG4 LACTIC ACID, ED: LACTIC ACID, VENOUS: 1.43 mmol/L (ref 0.5–2.2)

## 2013-03-24 MED ORDER — HYDROMORPHONE HCL PF 1 MG/ML IJ SOLN
1.0000 mg | Freq: Once | INTRAMUSCULAR | Status: AC
Start: 2013-03-24 — End: 2013-03-24
  Administered 2013-03-24: 1 mg via INTRAMUSCULAR
  Filled 2013-03-24: qty 1

## 2013-03-24 MED ORDER — PROMETHAZINE HCL 25 MG PO TABS
25.0000 mg | ORAL_TABLET | Freq: Once | ORAL | Status: AC
  Administered 2013-03-24: 25 mg via ORAL
  Filled 2013-03-24: qty 1

## 2013-03-24 MED ORDER — SUCRALFATE 1 G PO TABS: 1.0000 g | ORAL_TABLET | Freq: Three times a day (TID) | ORAL | Status: DC

## 2013-03-24 MED ORDER — GI COCKTAIL ~~LOC~~
30.0000 mL | Freq: Once | ORAL | Status: AC
  Administered 2013-03-24: 30 mL via ORAL
  Filled 2013-03-24: qty 30

## 2013-03-24 NOTE — ED Provider Notes (Signed)
CSN: [id number]     Arrival date & time 03/24/13  1600 History   First MD Initiated Contact with Patient 03/24/13 1629     Chief Complaint  Patient presents with  . Abdominal Pain  . Rectal Bleeding     (Consider location/radiation/quality/duration/timing/severity/associated sxs/prior Treatment) Patient is a 48 y.o. female presenting with abdominal pain and hematochezia. The history is provided by the patient.  Abdominal Pain Pain location:  Epigastric Pain quality: sharp   Pain radiates to:  Back Pain severity:  Moderate Onset quality:  Gradual Timing:  Constant Progression:  Waxing and waning Chronicity:  Chronic Context: not recent illness and not trauma   Relieved by:  Nothing Worsened by:  Nothing tried Associated symptoms: diarrhea, hematochezia, nausea and vomiting   Associated symptoms: no cough, no fever and no shortness of breath   Rectal Bleeding Associated symptoms: vomiting   Associated symptoms: no abdominal pain and no fever     Past Medical History  Diagnosis Date  . Anxiety   . Thyroid disease   . Substance abuse   . Pneumonia   . Bronchitis   . Kidney stone   . Endometriosis    Past Surgical History  Procedure Laterality Date  . Abdominal hysterectomy    . Cholecystectomy    . Appendectomy    . Ex laporotomy     Family History  Problem Relation Age of Onset  . Thyroid disease Mother   . CAD Mother   . Thyroid disease Sister   . CAD Other    History  Substance Use Topics  . Smoking status: Current Every Day Smoker -- 0.50 packs/day  . Smokeless tobacco: Never Used  . Alcohol Use: No     Comment: socially   OB History   Grav Para Term Preterm Abortions TAB SAB Ect Mult Living                 Review of Systems  Constitutional: Negative for fever.  Respiratory: Negative for cough and shortness of breath.   Gastrointestinal: Positive for nausea, vomiting, diarrhea, hematochezia and anal bleeding. Negative for abdominal pain.  All  other systems reviewed and are negative.      Allergies  Sulfa antibiotics; Latex; Reglan; Zofran; Clindamycin/lincomycin; Morphine and related; and Penicillins  Home Medications   Current Outpatient Rx  Name  Route  Sig  Dispense  Refill  . acetaminophen (TYLENOL) 500 MG tablet   Oral   Take 500 mg by mouth every 6 (six) hours as needed for mild pain or moderate pain.          [name] [name]. albuterol (PROVENTIL HFA;VENTOLIN HFA) 108 (90 BASE) MCG/ACT inhaler   Inhalation   Inhale 1 puff into the lungs every 6 (six) hours as needed for wheezing or shortness of breath.         . diazepam (VALIUM) 5 MG tablet   Oral   Take 10 mg by mouth every 8 (eight) hours as needed for anxiety or muscle spasms.          . famotidine (PEPCID) 40 MG tablet   Oral   Take 40 mg by mouth daily.         [name] [name]. omeprazole (PRILOSEC OTC) 20 MG tablet   Oral   Take 20 mg by mouth daily.         . promethazine (PHENERGAN) 25 MG tablet   Oral   Take 25 mg by mouth every 6 (six) hours as needed for nausea or  vomiting.         . traMADol (ULTRAM) 50 MG tablet   Oral   Take 50 mg by mouth every 4 (four) hours as needed for moderate pain.           BP 138/83  Pulse 89  Temp(Src) 98.6 F (37 C) (Oral)  Resp 18  SpO2 100% Physical Exam  Nursing note and vitals reviewed. Constitutional: She is oriented to person, place, and time. She appears well-developed and well-nourished. No distress.  HENT:  Head: Normocephalic and atraumatic.  Eyes: EOM are normal. Pupils are equal, round, and reactive to light.  Neck: Normal range of motion. Neck supple.  Cardiovascular: Normal rate and regular rhythm.  Exam reveals no friction rub.   No murmur heard. Pulmonary/Chest: Effort normal and breath sounds normal. No respiratory distress. She has no wheezes. She has no rales.  Abdominal: Soft. She exhibits no distension. There is tenderness (epigastric). There is no rebound.  Musculoskeletal: Normal range of  motion. She exhibits no edema.  Neurological: She is alert and oriented to person, place, and time. No cranial nerve deficit. She exhibits normal muscle tone.  Skin: No rash noted. She is not diaphoretic.    ED Course  Procedures (including critical care time) Labs Review Labs Reviewed  COMPREHENSIVE METABOLIC PANEL - Abnormal; Notable for the following:    Potassium 3.5 (*)    BUN 5 (*)    Total Bilirubin <0.2 (*)    All other components within normal limits  POC OCCULT BLOOD, ED - Abnormal; Notable for the following:    Fecal Occult Bld POSITIVE (*)    All other components within normal limits  CBC  LIPASE, BLOOD  I-STAT CG4 LACTIC ACID, ED   Imaging Review No results found.  EKG Interpretation   None       MDM   Final diagnoses:  Vomiting  Gastritis    A 66-year-old female presents with epigastric pain. She's also some nausea vomiting. Pain radiates through to her back. This is been a chronic condition is been seen multiple times for this. Previously. She was seen yesterday at [facility], where she has an EGD scheduled in one week. She was sent home with normal labs and Phenergan by mouth. Here with stable vitals. She states some rectal bleeding with lots of black and bloody stool. She also reports vomiting which is nonbilious and nonbloody. She has negative Hemoccult. Review of her records shows multiple visits for similar abdominal pain with normal CT scans. I feel she has done well and obtain EEG next week. I do not feel she needs a CT scan today. She also has history of drug-seeking behavior and has multiple narcotic prescriptions filled in January mostly by her dentist. I do not feel she is her chronic pain medicine today and I am oriented she might be seeking narcotics.  Labs ok. Given IM Dilaudid for pain control here, instructed to f/u with GI as scheduled.   [name] Hait, MD 03/25/13 [phone]

## 2013-03-24 NOTE — ED Notes (Addendum)
Per pt, having abdominal pain for over a month-states unable to eat, having bloody stools-[facility] the [facility]-was told to f/u with GI

## 2013-03-24 NOTE — Discharge Instructions (Signed)

## 2013-03-27 DIAGNOSIS — F419 Anxiety disorder, unspecified: Secondary | ICD-10-CM | POA: Insufficient documentation

## 2013-03-27 DIAGNOSIS — F4310 Post-traumatic stress disorder, unspecified: Secondary | ICD-10-CM | POA: Insufficient documentation

## 2013-03-27 DIAGNOSIS — K2970 Gastritis, unspecified, without bleeding: Secondary | ICD-10-CM | POA: Insufficient documentation

## 2013-04-30 ENCOUNTER — Encounter (HOSPITAL_COMMUNITY): Payer: Self-pay | Admitting: Emergency Medicine

## 2013-04-30 ENCOUNTER — Emergency Department (HOSPITAL_COMMUNITY): Payer: 59

## 2013-04-30 ENCOUNTER — Emergency Department (HOSPITAL_COMMUNITY)
Admission: EM | Admit: 2013-04-30 | Discharge: 2013-05-01 | Disposition: A | Discharge status: Home / Self Care | Payer: 59 | Attending: Emergency Medicine | Admitting: Emergency Medicine

## 2013-04-30 DIAGNOSIS — M7989 Other specified soft tissue disorders: Secondary | ICD-10-CM | POA: Insufficient documentation

## 2013-04-30 DIAGNOSIS — Z862 Personal history of diseases of the blood and blood-forming organs and certain disorders involving the immune mechanism: Secondary | ICD-10-CM | POA: Insufficient documentation

## 2013-04-30 DIAGNOSIS — F411 Generalized anxiety disorder: Secondary | ICD-10-CM | POA: Insufficient documentation

## 2013-04-30 DIAGNOSIS — F17200 Nicotine dependence, unspecified, uncomplicated: Secondary | ICD-10-CM | POA: Insufficient documentation

## 2013-04-30 DIAGNOSIS — Z880 Allergy status to penicillin: Secondary | ICD-10-CM | POA: Insufficient documentation

## 2013-04-30 DIAGNOSIS — Z91040 Latex allergy status: Secondary | ICD-10-CM | POA: Insufficient documentation

## 2013-04-30 DIAGNOSIS — Z87442 Personal history of urinary calculi: Secondary | ICD-10-CM | POA: Insufficient documentation

## 2013-04-30 DIAGNOSIS — Z8742 Personal history of other diseases of the female genital tract: Secondary | ICD-10-CM | POA: Insufficient documentation

## 2013-04-30 DIAGNOSIS — J209 Acute bronchitis, unspecified: Secondary | ICD-10-CM | POA: Insufficient documentation

## 2013-04-30 DIAGNOSIS — Z8639 Personal history of other endocrine, nutritional and metabolic disease: Secondary | ICD-10-CM | POA: Insufficient documentation

## 2013-04-30 DIAGNOSIS — J40 Bronchitis, not specified as acute or chronic: Secondary | ICD-10-CM

## 2013-04-30 DIAGNOSIS — Z79899 Other long term (current) drug therapy: Secondary | ICD-10-CM | POA: Insufficient documentation

## 2013-04-30 DIAGNOSIS — Z8701 Personal history of pneumonia (recurrent): Secondary | ICD-10-CM | POA: Insufficient documentation

## 2013-04-30 MED ORDER — IPRATROPIUM BROMIDE 0.02 % IN SOLN
0.5000 mg | Freq: Once | RESPIRATORY_TRACT | Status: AC
  Administered 2013-04-30: 0.5 mg via RESPIRATORY_TRACT
  Filled 2013-04-30: qty 2.5

## 2013-04-30 MED ORDER — ALBUTEROL SULFATE (2.5 MG/3ML) 0.083% IN NEBU
5.0000 mg | INHALATION_SOLUTION | Freq: Once | RESPIRATORY_TRACT | Status: AC
  Administered 2013-04-30: 5 mg via RESPIRATORY_TRACT
  Filled 2013-04-30: qty 6

## 2013-04-30 MED ORDER — PREDNISONE 20 MG PO TABS
60.0000 mg | ORAL_TABLET | Freq: Once | ORAL | Status: AC
  Administered 2013-04-30: 60 mg via ORAL
  Filled 2013-04-30: qty 3

## 2013-04-30 MED ORDER — HYDROCOD POLST-CHLORPHEN POLST 10-8 MG/5ML PO LQCR
5.0000 mL | Freq: Once | ORAL | Status: AC
  Administered 2013-04-30: 5 mL via ORAL
  Filled 2013-04-30: qty 5

## 2013-04-30 NOTE — ED Notes (Signed)
Pt states she has had a cough for about a week  Pt states she has brown/green productive cough  Pt is c/o chest pain  States it hurts to breathe deep or cough  Pt states it feels like a tightness  Pt states she has woke with swelling in her right hand and ankles  Pt states she is also having pain in her right upper quadrant that she states feels like gall bladder pain but she has had her gall bladder removed  Pt is c/o shortness of breath

## 2013-04-30 NOTE — ED Provider Notes (Signed)
CSN: [id number]     Arrival date & time 04/30/13  2111 History   First MD Initiated Contact with Patient 04/30/13 2217     Chief Complaint  Patient presents with  . Cough  . Shortness of Breath  . Chest Pain     (Consider location/radiation/quality/duration/timing/severity/associated sxs/prior Treatment) HPI [name] [name] is a 48 y.o. female who presents to emergency department complaining of cough. Cough began a week ago. Productive with brown and green sputum. Reports associated chest pain, which she states is worsened with coughing. Pt is a smoker. Hx of bronchitis and pneumonia. Also reports post tussive emesis, no nausea. No abdominal pain. No changes in bowels. Pt denies taking any medications for this. Denies hx of cardiac problems. No recent travel or surgeries. No exogenous estrogen. No swelling in extremities.     Past Medical History  Diagnosis Date  . Anxiety   . Thyroid disease   . Substance abuse   . Pneumonia   . Bronchitis   . Kidney stone   . Endometriosis    Past Surgical History  Procedure Laterality Date  . Abdominal hysterectomy    . Cholecystectomy    . Appendectomy    . Ex laporotomy     Family History  Problem Relation Age of Onset  . Thyroid disease Mother   . CAD Mother   . Thyroid disease Sister   . CAD Other    History  Substance Use Topics  . Smoking status: Current Every Day Smoker -- 0.50 packs/day  . Smokeless tobacco: Never Used  . Alcohol Use: No     Comment: socially   OB History   Grav Para Term Preterm Abortions TAB SAB Ect Mult Living                 Review of Systems  Constitutional: Negative for fever and chills.  HENT: Positive for congestion and sore throat.   Respiratory: Positive for cough and shortness of breath. Negative for chest tightness.   Cardiovascular: Positive for chest pain and leg swelling. Negative for palpitations.  Gastrointestinal: Negative for nausea, vomiting, abdominal pain and diarrhea.   Genitourinary: Negative for dysuria, flank pain and pelvic pain.  Musculoskeletal: Negative for arthralgias, myalgias, neck pain and neck stiffness.  Skin: Negative for rash.  Neurological: Negative for dizziness, weakness and headaches.  All other systems reviewed and are negative.      Allergies  Sulfa antibiotics; Compazine; Latex; Reglan; Zofran; Clindamycin/lincomycin; Morphine and related; and Penicillins  Home Medications   Current Outpatient Rx  Name  Route  Sig  Dispense  Refill  . acetaminophen (TYLENOL) 500 MG tablet   Oral   Take 500 mg by mouth every 6 (six) hours as needed for mild pain or moderate pain.          . clonazePAM (KLONOPIN) 1 MG tablet   Oral   Take 1 mg by mouth 2 (two) times daily.         Marland Kitchen omeprazole (PRILOSEC OTC) 20 MG tablet   Oral   Take 20 mg by mouth daily.         . Oxycodone HCl 10 MG TABS   Oral   Take 10 mg by mouth every 4 (four) hours.          . promethazine (PHENERGAN) 25 MG tablet   Oral   Take 25 mg by mouth every 6 (six) hours as needed for nausea or vomiting.         Marland Kitchen  sucralfate (CARAFATE) 1 G tablet   Oral   Take 1 tablet (1 g total) by mouth 4 (four) times daily -  with meals and at bedtime.   40 tablet   0    BP 142/95  Pulse 91  Temp(Src) 97.6 F (36.4 C) (Oral)  Resp 20  SpO2 100% Physical Exam  Nursing note and vitals reviewed. Constitutional: She is oriented to person, place, and time. She appears well-developed and well-nourished. No distress.  HENT:  Head: Normocephalic.  Nose: Nose normal.  Mouth/Throat: Oropharynx is clear and moist.  Eyes: Conjunctivae are normal.  Neck: Neck supple.  Cardiovascular: Normal rate, regular rhythm and normal heart sounds.   Pulmonary/Chest: Effort normal. No respiratory distress. She has wheezes. She has no rales. She exhibits no tenderness.  Mild end expiratory wheezes in left lower lung  Abdominal: Soft. Bowel sounds are normal. She exhibits no  distension. There is no tenderness. There is no rebound.  Musculoskeletal: She exhibits no edema.  Neurological: She is alert and oriented to person, place, and time.  Skin: Skin is warm and dry.  Psychiatric: She has a normal mood and affect. Her behavior is normal.    ED Course  Procedures (including critical care time) Labs Review Labs Reviewed - No data to display Imaging Review Dg Chest 2 View  05/01/2013   CLINICAL DATA:  Cough and shortness of breath.  Chest pain.  EXAM: CHEST  2 VIEW  COMPARISON:  03/08/2013  FINDINGS: Normal heart size and mediastinal contours. No acute infiltrate or edema. No effusion or pneumothorax. No acute osseous findings. Cholecystectomy changes.  IMPRESSION: No active cardiopulmonary disease.   Electronically Signed   By: Tiburcio PeaJonathan  [name] M.D.   On: 05/01/2013 00:08     EKG Interpretation None      MDM   Final diagnoses:  Bronchitis    Patient here with a cough for one week with green and brown productive sputum. She's afebrile, vital signs are normal. Lung sounds are normal except for minimal expiratory wheeze on the left side. Chest x-ray obtained to rule out pneumonia. Chest x-ray is negative. She was given one neb treatment which helped her symptoms. Suspect she has a bronchitis. Instructed to stop smoking. Will start on prednisone taper, Zithromax for possible bacterial infection, and Tessalon for cough. She is to followup with primary care Dr.  Ceasar MonsFiled Vitals:   04/30/13 2222  BP: 142/95  Pulse: 91  Temp: 97.6 F (36.4 C)  TempSrc: Oral  Resp: 20  SpO2: 100%       [name] Jacobsonatyana A Darik Massing, PA-C 05/01/13 0037

## 2013-05-01 MED ORDER — PREDNISONE 20 MG PO TABS: 60.0000 mg | ORAL_TABLET | Freq: Once | ORAL | Status: DC

## 2013-05-01 MED ORDER — BENZONATATE 100 MG PO CAPS: 100.0000 mg | ORAL_CAPSULE | Freq: Three times a day (TID) | ORAL | Status: DC

## 2013-05-01 MED ORDER — PREDNISONE 10 MG PO TABS: ORAL_TABLET | ORAL | Status: DC

## 2013-05-01 MED ORDER — AZITHROMYCIN 250 MG PO TABS: 250.0000 mg | ORAL_TABLET | Freq: Every day | ORAL | Status: DC

## 2013-05-01 MED ORDER — ALBUTEROL SULFATE HFA 108 (90 BASE) MCG/ACT IN AERS
2.0000 | INHALATION_SPRAY | Freq: Once | RESPIRATORY_TRACT | Status: AC
  Administered 2013-05-01: 2 via RESPIRATORY_TRACT
  Filled 2013-05-01: qty 6.7

## 2013-05-01 NOTE — ED Provider Notes (Signed)
Medical screening examination/treatment/procedure(s) were performed by non-physician practitioner and as supervising physician I was immediately available for consultation/collaboration.   EKG Interpretation None        Layla MawKristen N Santosha Jividen, DO 05/01/13 [phone]

## 2013-05-01 NOTE — Discharge Instructions (Signed)
Take inhaler 2 puffs every 4 hrs. Stop smoking. Prednisone as prescribed until all gone. Tessalon for cough. Ibuprofen or tylenol for pain. Follow up with your primary care doctor.    Bronchitis Bronchitis is inflammation of the airways that extend from the windpipe into the lungs (bronchi). The inflammation often causes mucus to develop, which leads to a cough. If the inflammation becomes severe, it may cause shortness of breath. CAUSES  Bronchitis may be caused by:   Viral infections.   Bacteria.   Cigarette smoke.   Allergens, pollutants, and other irritants.  SIGNS AND SYMPTOMS  The most common symptom of bronchitis is a frequent cough that produces mucus. Other symptoms include:  Fever.   Body aches.   Chest congestion.   Chills.   Shortness of breath.   Sore throat.  DIAGNOSIS  Bronchitis is usually diagnosed through a medical history and physical exam. Tests, such as chest X-rays, are sometimes done to rule out other conditions.  TREATMENT  You may need to avoid contact with whatever caused the problem (smoking, for example). Medicines are sometimes needed. These may include:  Antibiotics. These may be prescribed if the condition is caused by bacteria.  Cough suppressants. These may be prescribed for relief of cough symptoms.   Inhaled medicines. These may be prescribed to help open your airways and make it easier for you to breathe.   Steroid medicines. These may be prescribed for those with recurrent (chronic) bronchitis. HOME CARE INSTRUCTIONS  Get plenty of rest.   Drink enough fluids to keep your urine clear or pale yellow (unless you have a medical condition that requires fluid restriction). Increasing fluids may help thin your secretions and will prevent dehydration.   Only take over-the-counter or prescription medicines as directed by your health care provider.  Only take antibiotics as directed. Make sure you finish them even if you start  to feel better.  Avoid secondhand smoke, irritating chemicals, and strong fumes. These will make bronchitis worse. If you are a smoker, quit smoking. Consider using nicotine gum or skin patches to help control withdrawal symptoms. Quitting smoking will help your lungs heal faster.   Put a cool-mist humidifier in your bedroom at night to moisten the air. This may help loosen mucus. Change the water in the humidifier daily. You can also run the hot water in your shower and sit in the bathroom with the door closed for 5 10 minutes.   Follow up with your health care provider as directed.   Wash your hands frequently to avoid catching bronchitis again or spreading an infection to others.  SEEK MEDICAL CARE IF: Your symptoms do not improve after 1 week of treatment.  SEEK IMMEDIATE MEDICAL CARE IF:  Your fever increases.  You have chills.   You have chest pain.   You have worsening shortness of breath.   You have bloody sputum.  You faint.  You have lightheadedness.  You have a severe headache.   You vomit repeatedly. MAKE SURE YOU:   Understand these instructions.  Will watch your condition.  Will get help right away if you are not doing well or get worse. Document Released: 01/18/2005 Document Revised: 11/08/2012 Document Reviewed: 09/12/2012 [facility] Patient Information 2014 [place].

## 2013-05-07 ENCOUNTER — Other Ambulatory Visit: Payer: Self-pay | Admitting: Family Medicine

## 2013-05-07 DIAGNOSIS — Z1231 Encounter for screening mammogram for malignant neoplasm of breast: Secondary | ICD-10-CM

## 2013-05-15 ENCOUNTER — Ambulatory Visit: Payer: 59

## 2013-05-17 ENCOUNTER — Encounter (HOSPITAL_COMMUNITY): Payer: Self-pay | Admitting: Emergency Medicine

## 2013-05-17 ENCOUNTER — Emergency Department (HOSPITAL_COMMUNITY)
Admission: EM | Admit: 2013-05-17 | Discharge: 2013-05-17 | Disposition: A | Discharge status: Home / Self Care | Payer: 59 | Attending: Emergency Medicine | Admitting: Emergency Medicine

## 2013-05-17 ENCOUNTER — Emergency Department (HOSPITAL_COMMUNITY): Payer: 59

## 2013-05-17 DIAGNOSIS — Z87442 Personal history of urinary calculi: Secondary | ICD-10-CM | POA: Insufficient documentation

## 2013-05-17 DIAGNOSIS — Z8701 Personal history of pneumonia (recurrent): Secondary | ICD-10-CM | POA: Insufficient documentation

## 2013-05-17 DIAGNOSIS — Z8742 Personal history of other diseases of the female genital tract: Secondary | ICD-10-CM | POA: Insufficient documentation

## 2013-05-17 DIAGNOSIS — Z91040 Latex allergy status: Secondary | ICD-10-CM | POA: Insufficient documentation

## 2013-05-17 DIAGNOSIS — F17200 Nicotine dependence, unspecified, uncomplicated: Secondary | ICD-10-CM | POA: Insufficient documentation

## 2013-05-17 DIAGNOSIS — Z8639 Personal history of other endocrine, nutritional and metabolic disease: Secondary | ICD-10-CM | POA: Insufficient documentation

## 2013-05-17 DIAGNOSIS — Z90710 Acquired absence of both cervix and uterus: Secondary | ICD-10-CM | POA: Insufficient documentation

## 2013-05-17 DIAGNOSIS — Z862 Personal history of diseases of the blood and blood-forming organs and certain disorders involving the immune mechanism: Secondary | ICD-10-CM | POA: Insufficient documentation

## 2013-05-17 DIAGNOSIS — F411 Generalized anxiety disorder: Secondary | ICD-10-CM | POA: Insufficient documentation

## 2013-05-17 DIAGNOSIS — R109 Unspecified abdominal pain: Secondary | ICD-10-CM | POA: Insufficient documentation

## 2013-05-17 DIAGNOSIS — Z880 Allergy status to penicillin: Secondary | ICD-10-CM | POA: Insufficient documentation

## 2013-05-17 DIAGNOSIS — Z79899 Other long term (current) drug therapy: Secondary | ICD-10-CM | POA: Insufficient documentation

## 2013-05-17 DIAGNOSIS — Z8709 Personal history of other diseases of the respiratory system: Secondary | ICD-10-CM | POA: Insufficient documentation

## 2013-05-17 DIAGNOSIS — Z9089 Acquired absence of other organs: Secondary | ICD-10-CM | POA: Insufficient documentation

## 2013-05-17 LAB — URINALYSIS, ROUTINE W REFLEX MICROSCOPIC
Bilirubin Urine: NEGATIVE
Glucose, UA: NEGATIVE mg/dL
Ketones, ur: NEGATIVE mg/dL
Leukocytes, UA: NEGATIVE
Nitrite: NEGATIVE
PROTEIN: NEGATIVE mg/dL
Specific Gravity, Urine: 1.01 (ref 1.005–1.030)
UROBILINOGEN UA: 0.2 mg/dL (ref 0.0–1.0)
pH: 7 (ref 5.0–8.0)

## 2013-05-17 LAB — CBC WITH DIFFERENTIAL/PLATELET
BASOS ABS: 0 10*3/uL (ref 0.0–0.1)
Basophils Relative: 0 % (ref 0–1)
Eosinophils Absolute: 0 10*3/uL (ref 0.0–0.7)
Eosinophils Relative: 0 % (ref 0–5)
HEMATOCRIT: 41.2 % (ref 36.0–46.0)
Hemoglobin: 14.7 g/dL (ref 12.0–15.0)
LYMPHS PCT: 27 % (ref 12–46)
Lymphs Abs: 1.6 10*3/uL (ref 0.7–4.0)
MCH: 31.9 pg (ref 26.0–34.0)
MCHC: 35.7 g/dL (ref 30.0–36.0)
MCV: 89.4 fL (ref 78.0–100.0)
Monocytes Absolute: 0.3 10*3/uL (ref 0.1–1.0)
Monocytes Relative: 4 % (ref 3–12)
Neutro Abs: 4.1 10*3/uL (ref 1.7–7.7)
Neutrophils Relative %: 69 % (ref 43–77)
PLATELETS: 255 10*3/uL (ref 150–400)
RBC: 4.61 MIL/uL (ref 3.87–5.11)
RDW: 12.9 % (ref 11.5–15.5)
WBC: 6 10*3/uL (ref 4.0–10.5)

## 2013-05-17 LAB — URINE MICROSCOPIC-ADD ON

## 2013-05-17 LAB — COMPREHENSIVE METABOLIC PANEL
ALT: 18 U/L (ref 0–35)
AST: 29 U/L (ref 0–37)
Albumin: 4.5 g/dL (ref 3.5–5.2)
Alkaline Phosphatase: 109 U/L (ref 39–117)
BILIRUBIN TOTAL: 0.3 mg/dL (ref 0.3–1.2)
BUN: 7 mg/dL (ref 6–23)
CHLORIDE: 99 meq/L (ref 96–112)
CO2: 21 meq/L (ref 19–32)
Calcium: 10.3 mg/dL (ref 8.4–10.5)
Creatinine, Ser: 0.65 mg/dL (ref 0.50–1.10)
GFR calc Af Amer: >90 mL/min (ref >90)
Glucose, Bld: 103 mg/dL — ABNORMAL HIGH (ref 70–99)
Potassium: 5.8 mEq/L — ABNORMAL HIGH (ref 3.7–5.3)
SODIUM: 135 meq/L — AB (ref 137–147)
Total Protein: 8.2 g/dL (ref 6.0–8.3)

## 2013-05-17 MED ORDER — SODIUM CHLORIDE 0.9 % IV BOLUS (SEPSIS)
1000.0000 mL | Freq: Once | INTRAVENOUS | Status: AC
  Administered 2013-05-17: 1000 mL via INTRAVENOUS

## 2013-05-17 MED ORDER — PROMETHAZINE HCL 25 MG RE SUPP: 25.0000 mg | Freq: Three times a day (TID) | RECTAL | Status: DC | PRN

## 2013-05-17 MED ORDER — HYDROMORPHONE HCL PF 1 MG/ML IJ SOLN
1.0000 mg | Freq: Once | INTRAMUSCULAR | Status: AC
  Administered 2013-05-17: 1 mg via INTRAVENOUS
  Filled 2013-05-17: qty 1

## 2013-05-17 MED ORDER — PROMETHAZINE HCL 25 MG/ML IJ SOLN
25.0000 mg | Freq: Once | INTRAMUSCULAR | Status: AC
  Administered 2013-05-17: 25 mg via INTRAMUSCULAR
  Filled 2013-05-17: qty 1

## 2013-05-17 NOTE — ED Notes (Signed)
Pt c/o left sided abd pain x 2 days; vomiting

## 2013-05-17 NOTE — ED Provider Notes (Addendum)
CSN: [id number]     Arrival date & time 05/17/13  1140 History   First MD Initiated Contact with Patient 05/17/13 1145     Chief Complaint  Patient presents with  . Abdominal Pain     (Consider location/radiation/quality/duration/timing/severity/associated sxs/prior Treatment) Patient is a 48 y.o. female presenting with abdominal pain. The history is provided by the patient.  Abdominal Pain Associated symptoms: no chest pain, no chills, no constipation, no cough, no dysuria, no fever, no hematuria, no shortness of breath, no sore throat, no vaginal bleeding and no vaginal discharge   pt with hx multiple prior abd surgeries (hysterectomy, appendx, choecystx), endometriosis, c/o left upper abd pain in past couple days. Constant. Sharp. Non radiating. No specific exacerbating or alleviating factors. No dysuria or hematuria. ?remote hx kidney stones. Notes nv x 2 today, no bloody or bilious emesis. Having normal bms, incl today, loose.  No pleuritic pain. No cough or uri c/o. No fever or chills. No cp or sob. States compliant w her normal meds, which include daily oxycodone, phenergan, prilosec, and carafate.     Past Medical History  Diagnosis Date  . Anxiety   . Thyroid disease   . Substance abuse   . Pneumonia   . Bronchitis   . Kidney stone   . Endometriosis    Past Surgical History  Procedure Laterality Date  . Abdominal hysterectomy    . Cholecystectomy    . Appendectomy    . Ex laporotomy     Family History  Problem Relation Age of Onset  . Thyroid disease Mother   . CAD Mother   . Thyroid disease Sister   . CAD Other    History  Substance Use Topics  . Smoking status: Current Every Day Smoker -- 0.50 packs/day  . Smokeless tobacco: Never Used  . Alcohol Use: No     Comment: socially   OB History   Grav Para Term Preterm Abortions TAB SAB Ect Mult Living                 Review of Systems  Constitutional: Negative for fever and chills.  HENT: Negative for  sore throat.   Eyes: Negative for redness.  Respiratory: Negative for cough and shortness of breath.   Cardiovascular: Negative for chest pain and leg swelling.  Gastrointestinal: Positive for abdominal pain. Negative for constipation and blood in stool.  Genitourinary: Negative for dysuria, hematuria, flank pain, vaginal bleeding and vaginal discharge.  Musculoskeletal: Negative for back pain and neck pain.  Skin: Negative for rash.  Neurological: Negative for headaches.  Hematological: Does not bruise/bleed easily.  Psychiatric/Behavioral: Negative for confusion.      Allergies  Sulfa antibiotics; Compazine; Latex; Reglan; Zofran; Clindamycin/lincomycin; Morphine and related; and Penicillins  Home Medications   Prior to Admission medications   Medication Sig Start Date End Date Taking? Authorizing Provider  acetaminophen (TYLENOL) 500 MG tablet Take 500 mg by mouth every 6 (six) hours as needed for mild pain or moderate pain.    Yes Historical Provider, MD  benzonatate (TESSALON) 100 MG capsule Take 1 capsule (100 mg total) by mouth every 8 (eight) hours. 05/01/13  Yes Tatyana A Kirichenko, PA-C  clonazePAM (KLONOPIN) 1 MG tablet Take 0.5-1 mg by mouth 2 (two) times daily. 0.5mg   as needed for anxiety   Yes Historical Provider, MD  diphenhydrAMINE (BENADRYL) 50 MG capsule Take 50 mg by mouth every 6 (six) hours as needed for sleep.   Yes Historical Provider, MD  omeprazole (PRILOSEC OTC) 20 MG tablet Take 20 mg by mouth daily.   Yes Historical Provider, MD  Oxycodone HCl 10 MG TABS Take 10 mg by mouth every 4 (four) hours.    Yes Historical Provider, MD  promethazine (PHENERGAN) 25 MG tablet Take 25 mg by mouth every 6 (six) hours as needed for nausea or vomiting.   Yes Historical Provider, MD  sucralfate (CARAFATE) 1 G tablet Take 1 tablet (1 g total) by mouth 4 (four) times daily -  with meals and at bedtime. 03/24/13  Yes [name], MD   BP 135/88  Pulse 121  Temp(Src)  98.3 F (36.8 C) (Oral)  Resp 20  SpO2 100% Physical Exam  Nursing note and vitals reviewed. Constitutional: She is oriented to person, place, and time. She appears well-developed and well-nourished. No distress.  HENT:  Mouth/Throat: Oropharynx is clear and moist.  Eyes: Conjunctivae are normal. No scleral icterus.  Neck: Neck supple. No tracheal deviation present.  Cardiovascular: Normal rate, regular rhythm, normal heart sounds and intact distal pulses.  Exam reveals no gallop and no friction rub.   No murmur heard. Pulmonary/Chest: Effort normal and breath sounds normal. No respiratory distress.  Abdominal: Soft. Normal appearance and bowel sounds are normal. She exhibits no distension and no mass. There is no tenderness. There is no rebound and no guarding.  No incarc hernia.   Genitourinary:  No cva tenderness  Musculoskeletal: She exhibits no edema and no tenderness.  Neurological: She is alert and oriented to person, place, and time.  Skin: Skin is warm and dry. No rash noted.  Psychiatric: She has a normal mood and affect.    ED Course  Procedures (including critical care time)  Results for orders placed during the hospital encounter of 05/17/13  CBC WITH DIFFERENTIAL      Result Value Ref Range   WBC 6.0  4.0 - 10.5 K/uL   RBC 4.61  3.87 - 5.11 MIL/uL   Hemoglobin 14.7  12.0 - 15.0 g/dL   HCT 16.1  09.6 - 04.5 %   MCV 89.4  78.0 - 100.0 fL   MCH 31.9  26.0 - 34.0 pg   MCHC 35.7  30.0 - 36.0 g/dL   RDW 40.9  81.1 - 91.4 %   Platelets 255  150 - 400 K/uL   Neutrophils Relative % 69  43 - 77 %   Neutro Abs 4.1  1.7 - 7.7 K/uL   Lymphocytes Relative 27  12 - 46 %   Lymphs Abs 1.6  0.7 - 4.0 K/uL   Monocytes Relative 4  3 - 12 %   Monocytes Absolute 0.3  0.1 - 1.0 K/uL   Eosinophils Relative 0  0 - 5 %   Eosinophils Absolute 0.0  0.0 - 0.7 K/uL   Basophils Relative 0  0 - 1 %   Basophils Absolute 0.0  0.0 - 0.1 K/uL  COMPREHENSIVE METABOLIC PANEL      Result  Value Ref Range   Sodium 135 (*) 137 - 147 mEq/L   Potassium 5.8 (*) 3.7 - 5.3 mEq/L   Chloride 99  96 - 112 mEq/L   CO2 21  19 - 32 mEq/L   Glucose, Bld 103 (*) 70 - 99 mg/dL   BUN 7  6 - 23 mg/dL   Creatinine, Ser 7.82  0.50 - 1.10 mg/dL   Calcium 95.6  8.4 - 21.3 mg/dL   Total Protein 8.2  6.0 - 8.3 g/dL  Albumin 4.5  3.5 - 5.2 g/dL   AST 29  0 - 37 U/L   ALT 18  0 - 35 U/L   Alkaline Phosphatase 109  39 - 117 U/L   Total Bilirubin 0.3  0.3 - 1.2 mg/dL   GFR calc non Af Amer >90  >90 mL/min   GFR calc Af Amer >90  >90 mL/min  URINALYSIS, ROUTINE W REFLEX MICROSCOPIC      Result Value Ref Range   Color, Urine YELLOW  YELLOW   APPearance CLEAR  CLEAR   Specific Gravity, Urine 1.010  1.005 - 1.030   pH 7.0  5.0 - 8.0   Glucose, UA NEGATIVE  NEGATIVE mg/dL   Hgb urine dipstick MODERATE (*) NEGATIVE   Bilirubin Urine NEGATIVE  NEGATIVE   Ketones, ur NEGATIVE  NEGATIVE mg/dL   Protein, ur NEGATIVE  NEGATIVE mg/dL   Urobilinogen, UA 0.2  0.0 - 1.0 mg/dL   Nitrite NEGATIVE  NEGATIVE   Leukocytes, UA NEGATIVE  NEGATIVE  URINE MICROSCOPIC-ADD ON      Result Value Ref Range   Squamous Epithelial / LPF RARE  RARE   WBC, UA 0-2  <3 WBC/hpf   RBC / HPF 3-6  <3 RBC/hpf   Bacteria, UA FEW (*) RARE   Dg Chest 2 View  05/01/2013   CLINICAL DATA:  Cough and shortness of breath.  Chest pain.  EXAM: CHEST  2 VIEW  COMPARISON:  03/08/2013  FINDINGS: Normal heart size and mediastinal contours. No acute infiltrate or edema. No effusion or pneumothorax. No acute osseous findings. Cholecystectomy changes.  IMPRESSION: No active cardiopulmonary disease.   Electronically Signed   By: Tiburcio PeaJonathan  Watts M.D.   On: 05/01/2013 00:08   Dg Abd Acute W/chest  05/17/2013   CLINICAL DATA:  Nausea and vomiting.  Left-sided abdominal pain.  EXAM: ACUTE ABDOMEN SERIES (ABDOMEN 2 VIEW & CHEST 1 VIEW)  COMPARISON:  DG CHEST 2 VIEW dated 04/30/2013; DG ABD ACUTE W/CHEST dated 03/08/2013  FINDINGS: Normal heart size.   Clear lungs.  No free intraperitoneal gas. Nonspecific air-fluid levels. No disproportionate dilatation of bowel. Postoperative changes are noted.  IMPRESSION: No acute cardiopulmonary disease.  Nonobstructive bowel gas pattern.   Electronically Signed   By: Maryclare BeanArt  Hoss M.D.   On: 05/17/2013 13:53      MDM  Iv ns bolus. Pt requests pain rx, dilaudid 1 mg iv.    Labs.  Reviewed nursing notes and prior charts for additional history.   Pt requests additional pain meds, dilaudid 1 mg iv.  Po fluids.  Pt w no episodes nvd in ED.  Renal fxn at baseline, no ketones on ua.  abd films without obstruction. abd soft nt. Ct from approximately 6 months ago w no acute process, no kidney stones then.  Pt appears comfortable on recheck, afeb, hr 94, rr 16, and stable for d/c.   Prior notes reviewed - note from prior treating provider included below: 47yF with abdominal pain. This has been ongoing since September. No acute change. Some tenderness on exam, but not peritoneal. Suspect PUD. Pt already taking carafate. Will add PPI. Encouraged to stop smoking. Already avoids NSAIDs. GI follow-up.  Some concern for drug seeking behavior. Hx of substance abuse, specific medication requests and that be given IV and concerning pattern noted on [place] controlled substance database.  Numerous different addresses and some just slight variations ([address] and [address], [address]  and [address]).  Numerous different providers.  03/05/13 - # 30 HYDROCODONE- ACETAMINOPHEN 5- 325  03/04/13 - # 90 DIAZEPAM 10 MG  03/01/13 - #7 OXYCODONE- ACETAMINOPHEN 10- 325  03/01/13 - #30 OXYCODONE- ACETAMINOPHEN 5- 325  02/27/13 - #30 TRAMADOL HCL 50 MG  02/23/13 - #30 OXYCODONE- ACETAMINOPHEN 10- 325  02/16/12 - #30 OXYCODONE- ACETAMINOPHEN 10- 325  02/12/13 - #16 OXYCODONE- ACETAMINOPHEN 5- 325  02/09/13 - #16 OXYCODONE- ACETAMINOPHEN 5- 325  02/05/13 - #30 OXYCODONE- ACETAMINOPHEN 10- 325  02/01/13 - # 90  DIAZEPAM 10 MG   As such, will refer back to pcp for recheck, and should pt need any additional narcotic rx,  Also to follow up with gi.  Pt reports that her gi md in [place] and done egd and colonoscopy within past few months neg for acute process.     Suzi Roots, MD 05/17/13 [phone]

## 2013-05-17 NOTE — ED Notes (Signed)
Pt states she is a hard stick and usually requires US guided IV stick.

## 2013-05-17 NOTE — Discharge Instructions (Signed)
Rest. Drink plenty of fluids. Use phenergan suppository as need for nausea - may make drowsy, no driving when taking. Follow up with your primary care doctor in the next 1-2 days for recheck. Should your require prescription for narcotic pain medication, you will need to discuss that with your primary care doctor. Return to ER if worse, new symptoms, persistent vomiting, fevers, other concern.   You were given pain medication in the ER - no driving for the next 6 hours.     Abdominal Pain, Adult Many things can cause abdominal pain. Usually, abdominal pain is not caused by a disease and will improve without treatment. It can often be observed and treated at home. Your health care provider will do a physical exam and possibly order blood tests and X-rays to help determine the seriousness of your pain. However, in many cases, more time must pass before a clear cause of the pain can be found. Before that point, your health care provider may not know if you need more testing or further treatment. HOME CARE INSTRUCTIONS  Monitor your abdominal pain for any changes. The following actions may help to alleviate any discomfort you are experiencing:  Only take over-the-counter or prescription medicines as directed by your health care provider.  Do not take laxatives unless directed to do so by your health care provider.  Try a clear liquid diet (broth, tea, or water) as directed by your health care provider. Slowly move to a bland diet as tolerated. SEEK MEDICAL CARE IF:  You have unexplained abdominal pain.  You have abdominal pain associated with nausea or diarrhea.  You have pain when you urinate or have a bowel movement.  You experience abdominal pain that wakes you in the night.  You have abdominal pain that is worsened or improved by eating food.  You have abdominal pain that is worsened with eating fatty foods. SEEK IMMEDIATE MEDICAL CARE IF:   Your pain does not go away within 2  hours.  You have a fever.  You keep throwing up (vomiting).  Your pain is felt only in portions of the abdomen, such as the right side or the left lower portion of the abdomen.  You pass bloody or black tarry stools. MAKE SURE YOU:  Understand these instructions.   Will watch your condition.   Will get help right away if you are not doing well or get worse.  Document Released: 10/28/2004 Document Revised: 11/08/2012 Document Reviewed: 09/27/2012 [facility]Exit[facility] Patient Information 2014 [place], [place].    Abdominal Pain, Women Abdominal (stomach, pelvic, or belly) pain can be caused by many things. It is important to tell your doctor:  The location of the pain.  Does it come and go or is it present all the time?  Are there things that start the pain (eating certain foods, exercise)?  Are there other symptoms associated with the pain (fever, nausea, vomiting, diarrhea)? All of this is helpful to know when trying to find the cause of the pain. CAUSES   Stomach: virus or bacteria infection, or ulcer.  Intestine: appendicitis (inflamed appendix), regional ileitis (Crohn's disease), ulcerative colitis (inflamed colon), irritable bowel syndrome, diverticulitis (inflamed diverticulum of the colon), or cancer of the stomach or intestine.  Gallbladder disease or stones in the gallbladder.  Kidney disease, kidney stones, or infection.  Pancreas infection or cancer.  Fibromyalgia (pain disorder).  Diseases of the female organs:  Uterus: fibroid (non-cancerous) tumors or infection.  Fallopian tubes: infection or tubal pregnancy.  Ovary: cysts  or tumors.  Pelvic adhesions (scar tissue).  Endometriosis (uterus lining tissue growing in the pelvis and on the pelvic organs).  Pelvic congestion syndrome (female organs filling up with blood just before the menstrual period).  Pain with the menstrual period.  Pain with ovulation (producing an egg).  Pain with an IUD  (intrauterine device, birth control) in the uterus.  Cancer of the female organs.  Functional pain (pain not caused by a disease, may improve without treatment).  Psychological pain.  Depression. DIAGNOSIS  Your doctor will decide the seriousness of your pain by doing an examination.  Blood tests.  X-rays.  Ultrasound.  CT scan (computed tomography, special type of X-ray).  MRI (magnetic resonance imaging).  Cultures, for infection.  Barium enema (dye inserted in the large intestine, to better view it with X-rays).  Colonoscopy (looking in intestine with a lighted tube).  Laparoscopy (minor surgery, looking in abdomen with a lighted tube).  Major abdominal exploratory surgery (looking in abdomen with a large incision). TREATMENT  The treatment will depend on the cause of the pain.   Many cases can be observed and treated at home.  Over-the-counter medicines recommended by your caregiver.  Prescription medicine.  Antibiotics, for infection.  Birth control pills, for painful periods or for ovulation pain.  Hormone treatment, for endometriosis.  Nerve blocking injections.  Physical therapy.  Antidepressants.  Counseling with a psychologist or psychiatrist.  Minor or major surgery. HOME CARE INSTRUCTIONS   Do not take laxatives, unless directed by your caregiver.  Take over-the-counter pain medicine only if ordered by your caregiver. Do not take aspirin because it can cause an upset stomach or bleeding.  Try a clear liquid diet (broth or water) as ordered by your caregiver. Slowly move to a bland diet, as tolerated, if the pain is related to the stomach or intestine.  Have a thermometer and take your temperature several times a day, and record it.  Bed rest and sleep, if it helps the pain.  Avoid sexual intercourse, if it causes pain.  Avoid stressful situations.  Keep your follow-up appointments and tests, as your caregiver orders.  If the pain  does not go away with medicine or surgery, you may try:  Acupuncture.  Relaxation exercises (yoga, meditation).  Group therapy.  Counseling. SEEK MEDICAL CARE IF:   You notice certain foods cause stomach pain.  Your home care treatment is not helping your pain.  You need stronger pain medicine.  You want your IUD removed.  You feel faint or lightheaded.  You develop nausea and vomiting.  You develop a rash.  You are having side effects or an allergy to your medicine. SEEK IMMEDIATE MEDICAL CARE IF:   Your pain does not go away or gets worse.  You have a fever.  Your pain is felt only in portions of the abdomen. The right side could possibly be appendicitis. The left lower portion of the abdomen could be colitis or diverticulitis.  You are passing blood in your stools (bright red or black tarry stools, with or without vomiting).  You have blood in your urine.  You develop chills, with or without a fever.  You pass out. MAKE SURE YOU:   Understand these instructions.  Will watch your condition.  Will get help right away if you are not doing well or get worse. Document Released: 11/15/2006 Document Revised: 04/12/2011 Document Reviewed: 12/05/2008 [facility] Patient Information 2014 [place], [place]LL[place].

## 2013-05-30 ENCOUNTER — Emergency Department (HOSPITAL_COMMUNITY): Payer: 59

## 2013-05-30 ENCOUNTER — Emergency Department (HOSPITAL_COMMUNITY)
Admission: EM | Admit: 2013-05-30 | Discharge: 2013-05-30 | Disposition: A | Discharge status: Home / Self Care | Payer: 59 | Attending: Emergency Medicine | Admitting: Emergency Medicine

## 2013-05-30 ENCOUNTER — Encounter (HOSPITAL_COMMUNITY): Payer: Self-pay | Admitting: Emergency Medicine

## 2013-05-30 DIAGNOSIS — Z880 Allergy status to penicillin: Secondary | ICD-10-CM | POA: Insufficient documentation

## 2013-05-30 DIAGNOSIS — Z87442 Personal history of urinary calculi: Secondary | ICD-10-CM | POA: Insufficient documentation

## 2013-05-30 DIAGNOSIS — E079 Disorder of thyroid, unspecified: Secondary | ICD-10-CM | POA: Insufficient documentation

## 2013-05-30 DIAGNOSIS — Z79899 Other long term (current) drug therapy: Secondary | ICD-10-CM | POA: Insufficient documentation

## 2013-05-30 DIAGNOSIS — S93401A Sprain of unspecified ligament of right ankle, initial encounter: Secondary | ICD-10-CM

## 2013-05-30 DIAGNOSIS — Z8701 Personal history of pneumonia (recurrent): Secondary | ICD-10-CM | POA: Insufficient documentation

## 2013-05-30 DIAGNOSIS — Y9301 Activity, walking, marching and hiking: Secondary | ICD-10-CM | POA: Insufficient documentation

## 2013-05-30 DIAGNOSIS — F411 Generalized anxiety disorder: Secondary | ICD-10-CM | POA: Insufficient documentation

## 2013-05-30 DIAGNOSIS — S93409A Sprain of unspecified ligament of unspecified ankle, initial encounter: Secondary | ICD-10-CM | POA: Insufficient documentation

## 2013-05-30 DIAGNOSIS — Z8742 Personal history of other diseases of the female genital tract: Secondary | ICD-10-CM | POA: Insufficient documentation

## 2013-05-30 DIAGNOSIS — F17200 Nicotine dependence, unspecified, uncomplicated: Secondary | ICD-10-CM | POA: Insufficient documentation

## 2013-05-30 DIAGNOSIS — Z91040 Latex allergy status: Secondary | ICD-10-CM | POA: Insufficient documentation

## 2013-05-30 DIAGNOSIS — Y9289 Other specified places as the place of occurrence of the external cause: Secondary | ICD-10-CM | POA: Insufficient documentation

## 2013-05-30 DIAGNOSIS — R110 Nausea: Secondary | ICD-10-CM | POA: Insufficient documentation

## 2013-05-30 DIAGNOSIS — X500XXA Overexertion from strenuous movement or load, initial encounter: Secondary | ICD-10-CM | POA: Insufficient documentation

## 2013-05-30 NOTE — Discharge Instructions (Signed)

## 2013-05-30 NOTE — ED Notes (Addendum)
Pt c/o increasing R foot/ankle pain radiating into lower leg x "1 to 1.5 weeks."  Pain score 7/10.  Pt denies injury and sts "I don't know how it happened.  I walk dogs for a living."  Pt reports taking suboxone and tylenol w/ "some relief."  Swelling noted to R ankle.  Hx of chronic abdominal pain and substance abuse.

## 2013-05-30 NOTE — ED Provider Notes (Signed)
CSN: [id number]     Arrival date & time 05/30/13  1819 History  This chart was scribed for non-physician practitioner, Johnnette Gourdobyn Albert, PA-C, working with Celene KrasJon R Knapp, MD, by Ellin MayhewMichael Levi, ED Scribe. This patient was seen in room WTR7/WTR7 and the patient's care was started at 7:00 PM.  The history is provided by the patient. No language interpreter was used.   HPI Comments: [name] RotaKaren [name] is a 48 y.o. female who presents to the Emergency Department with a chief complaint of atraumatic R foot pain with radiation to to upper leg behind R knee with onset 2 weeks. This is a new problem. She states she is unsure if she stepped on uneven ground while walking dogs earlier today. Patient characterizes the pain as a sharp pain and rates the severity as a 8/10 while walking and 5/10 while resting. Patient states symptoms are progressively worsening. She confirms paresthesias in the R foot and denies any numbness. Patient reports associated nausea secondary to pain.  Patient reports trying to elevate the area and taking suboxone and tylenol at 3 hours ago with no relief. 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  Past Medical History  Diagnosis Date  . Anxiety   . Thyroid disease   . Substance abuse   . Pneumonia   . Bronchitis   . Kidney stone   . Endometriosis    Past Surgical History  Procedure Laterality Date  . Abdominal hysterectomy    . Cholecystectomy    . Appendectomy    . Ex laporotomy     Family History  Problem Relation Age of Onset  . Thyroid disease Mother   . CAD Mother   . Thyroid disease Sister   . CAD Other    History  Substance Use Topics  . Smoking status: Current Every Day Smoker -- 0.50 packs/day  . Smokeless tobacco: Never Used  . Alcohol Use: No     Comment: socially   OB History   Grav Para Term Preterm Abortions TAB SAB Ect Mult Living                 Review of Systems  Constitutional: Negative for fever, chills, activity change and appetite change.   Respiratory: Negative for shortness of breath.   Cardiovascular: Negative for chest pain.  Gastrointestinal: Positive for nausea. Negative for vomiting, abdominal pain and diarrhea.  Musculoskeletal: Positive for arthralgias and joint swelling. Negative for back pain, gait problem, neck pain and neck stiffness.  Neurological: Negative for dizziness, weakness, numbness and headaches.  A complete 10 system review of systems was obtained and all systems are negative except as noted in the HPI and PMH.   Allergies  Sulfa antibiotics; Compazine; Latex; Reglan; Zofran; Clindamycin/lincomycin; Morphine and related; and Penicillins  Home Medications   Prior to Admission medications   Medication Sig Start Date End Date Taking? Authorizing Provider  acetaminophen (TYLENOL) 500 MG tablet Take 500 mg by mouth every 6 (six) hours as needed for mild pain or moderate pain.     Historical Provider, MD  benzonatate (TESSALON) 100 MG capsule Take 1 capsule (100 mg total) by mouth every 8 (eight) hours. 05/01/13   Tatyana A Kirichenko, PA-C  clonazePAM (KLONOPIN) 1 MG tablet Take 0.5-1 mg by mouth 2 (two) times daily. 0.5mg   as needed for anxiety    Historical Provider, MD  diphenhydrAMINE (BENADRYL) 50 MG capsule Take 50 mg by mouth every 6 (six) hours as needed for sleep.    Historical Provider,  MD  omeprazole (PRILOSEC OTC) 20 MG tablet Take 20 mg by mouth daily.    Historical Provider, MD  Oxycodone HCl 10 MG TABS Take 10 mg by mouth every 4 (four) hours.     Historical Provider, MD  promethazine (PHENERGAN) 25 MG suppository Place 1 suppository (25 mg total) rectally every 8 (eight) hours as needed for nausea or vomiting. 05/17/13   Suzi RootsKevin E Steinl, MD  promethazine (PHENERGAN) 25 MG tablet Take 25 mg by mouth every 6 (six) hours as needed for nausea or vomiting.    Historical Provider, MD  sucralfate (CARAFATE) 1 G tablet Take 1 tablet (1 g total) by mouth 4 (four) times daily -  with meals and at bedtime.  03/24/13   Dagmar HaitWilliam Blair Walden, MD   Triage Vitals: BP 138/87  Pulse 85  Temp(Src) 97.9 F (36.6 C) (Oral)  Resp 20  SpO2 100%  Physical Exam  Nursing note and vitals reviewed. Constitutional: She is oriented to person, place, and time. She appears well-developed and well-nourished. No distress.  HENT:  Head: Normocephalic and atraumatic.  Mouth/Throat: Oropharynx is clear and moist.  Eyes: Conjunctivae and EOM are normal.  Neck: Normal range of motion. Neck supple.  Cardiovascular: Normal rate, regular rhythm and normal heart sounds.   Pulses:      Dorsalis pedis pulses are 2+ on the right side.       Posterior tibial pulses are 2+ on the right side.  Pulmonary/Chest: Effort normal and breath sounds normal. No respiratory distress.  Musculoskeletal: She exhibits no edema.       Right ankle: She exhibits decreased range of motion (Secondary to pain) and swelling. Tenderness. Lateral malleolus and medial malleolus tenderness found.  No erythema or bruising.  Neurological: She is alert and oriented to person, place, and time. No sensory deficit.  Skin: Skin is warm and dry.  Psychiatric: She has a normal mood and affect. Her behavior is normal.   ED Course  Procedures (including critical care time)  COORDINATION OF CARE: 7:14 PM-Xray ordered and will f/u with patient. Treatment plan discussed with patient and patient agrees.  7:37 PM-Discussed x-ray findings with patient.   Imaging Review Dg Ankle Complete Right  05/30/2013   CLINICAL DATA:  Ankle pain.  No known injury.  EXAM: RIGHT ANKLE - COMPLETE 3+ VIEW  COMPARISON:  None.  FINDINGS: The ankle mortise is maintained. No acute ankle fracture. No osteochondral lesion. The mid and hindfoot bony structures are intact. A moderate-sized calcaneal heel spur is noted.  IMPRESSION: No acute bony findings.  Moderate-sized calcaneal heel spur.   Electronically Signed   By: Loralie ChampagneMark  Gallerani M.D.   On: 05/30/2013 19:28   MDM   Final  diagnoses:  Right ankle sprain    Neurovascularly intact. X-ray without any acute findings. She appears in no apparent distress, vital signs stable. After discussing x-ray findings, patient reports she is nauseated from the pain and is requesting Phenergan because she is allergic to zofran and reglan. I told her that phenergan is not appropriate treatment at this time, she states "never mind, you are not giving me that because I am on Suboxone, I will go to my doctor tomorrow". Advised RICE, NSAIDs, pt states she is allergic to NSAIDs. Stable for d/c.   I personally performed the services described in this documentation, which was scribed in my presence. The recorded information has been reviewed and is accurate.    Trevor MaceRobyn M Albert, PA-C 05/30/13 1951

## 2013-05-30 NOTE — ED Notes (Signed)
Ortho tech called for crutches and ACE wrap.

## 2013-06-01 NOTE — ED Provider Notes (Signed)
Medical screening examination/treatment/procedure(s) were performed by non-physician practitioner and as supervising physician I was immediately available for consultation/collaboration.    Aasir Daigler R Jema Deegan, MD 06/01/13 1548 

## 2013-06-06 DIAGNOSIS — M79673 Pain in unspecified foot: Secondary | ICD-10-CM | POA: Insufficient documentation

## 2013-06-13 ENCOUNTER — Ambulatory Visit: Payer: 59

## 2013-06-19 ENCOUNTER — Ambulatory Visit: Payer: 59

## 2013-06-25 ENCOUNTER — Emergency Department (HOSPITAL_COMMUNITY)
Admission: EM | Admit: 2013-06-25 | Discharge: 2013-06-25 | Disposition: A | Discharge status: Home / Self Care | Payer: 59 | Attending: Emergency Medicine | Admitting: Emergency Medicine

## 2013-06-25 ENCOUNTER — Encounter (HOSPITAL_COMMUNITY): Payer: Self-pay | Admitting: Emergency Medicine

## 2013-06-25 ENCOUNTER — Emergency Department (HOSPITAL_COMMUNITY): Payer: 59

## 2013-06-25 DIAGNOSIS — Z79899 Other long term (current) drug therapy: Secondary | ICD-10-CM | POA: Insufficient documentation

## 2013-06-25 DIAGNOSIS — Z8709 Personal history of other diseases of the respiratory system: Secondary | ICD-10-CM | POA: Insufficient documentation

## 2013-06-25 DIAGNOSIS — Z862 Personal history of diseases of the blood and blood-forming organs and certain disorders involving the immune mechanism: Secondary | ICD-10-CM | POA: Insufficient documentation

## 2013-06-25 DIAGNOSIS — IMO0002 Reserved for concepts with insufficient information to code with codable children: Secondary | ICD-10-CM | POA: Insufficient documentation

## 2013-06-25 DIAGNOSIS — Z87442 Personal history of urinary calculi: Secondary | ICD-10-CM | POA: Insufficient documentation

## 2013-06-25 DIAGNOSIS — M79641 Pain in right hand: Secondary | ICD-10-CM

## 2013-06-25 DIAGNOSIS — F17200 Nicotine dependence, unspecified, uncomplicated: Secondary | ICD-10-CM | POA: Insufficient documentation

## 2013-06-25 DIAGNOSIS — S6990XA Unspecified injury of unspecified wrist, hand and finger(s), initial encounter: Secondary | ICD-10-CM | POA: Insufficient documentation

## 2013-06-25 DIAGNOSIS — W230XXA Caught, crushed, jammed, or pinched between moving objects, initial encounter: Secondary | ICD-10-CM | POA: Insufficient documentation

## 2013-06-25 DIAGNOSIS — Y929 Unspecified place or not applicable: Secondary | ICD-10-CM | POA: Insufficient documentation

## 2013-06-25 DIAGNOSIS — Z8701 Personal history of pneumonia (recurrent): Secondary | ICD-10-CM | POA: Insufficient documentation

## 2013-06-25 DIAGNOSIS — R209 Unspecified disturbances of skin sensation: Secondary | ICD-10-CM | POA: Insufficient documentation

## 2013-06-25 DIAGNOSIS — F411 Generalized anxiety disorder: Secondary | ICD-10-CM | POA: Insufficient documentation

## 2013-06-25 DIAGNOSIS — Z8742 Personal history of other diseases of the female genital tract: Secondary | ICD-10-CM | POA: Insufficient documentation

## 2013-06-25 DIAGNOSIS — Z91040 Latex allergy status: Secondary | ICD-10-CM | POA: Insufficient documentation

## 2013-06-25 DIAGNOSIS — Z8639 Personal history of other endocrine, nutritional and metabolic disease: Secondary | ICD-10-CM | POA: Insufficient documentation

## 2013-06-25 DIAGNOSIS — Y9389 Activity, other specified: Secondary | ICD-10-CM | POA: Insufficient documentation

## 2013-06-25 DIAGNOSIS — Z880 Allergy status to penicillin: Secondary | ICD-10-CM | POA: Insufficient documentation

## 2013-06-25 DIAGNOSIS — R110 Nausea: Secondary | ICD-10-CM | POA: Insufficient documentation

## 2013-06-25 MED ORDER — PROMETHAZINE HCL 25 MG PO TABS
12.5000 mg | ORAL_TABLET | Freq: Once | ORAL | Status: AC
  Administered 2013-06-25: 12.5 mg via ORAL
  Filled 2013-06-25: qty 1

## 2013-06-25 NOTE — ED Provider Notes (Signed)
Medical screening examination/treatment/procedure(s) were performed by non-physician practitioner and as supervising physician I was immediately available for consultation/collaboration.   EKG Interpretation None        Bertis Hustead H Gabriell Casimir, MD 06/25/13 2331 
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
## 2013-06-25 NOTE — ED Notes (Signed)
Pt noted to be walking out of ED, per family "she can't be reasoned with when she gets like this".  Family encouraged to have pt return if needed.  EDPA aware.

## 2013-06-25 NOTE — ED Notes (Signed)
Ortho tech called to place splint. 
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
## 2013-06-25 NOTE — Discharge Instructions (Signed)
Please call your doctor for a followup appointment within 24-48 hours. When you talk to your doctor please let them know that you were seen in the emergency department and have them acquire all of your records so that they can discuss the findings with you and formulate a treatment plan to fully care for your new and ongoing problems. Please call hand physician Please rest, ice, elevate Please report back to the ED symptoms are to worsen or change  Arthralgia Your caregiver has diagnosed you as suffering from an arthralgia. Arthralgia means there is pain in a joint. This can come from many reasons including:  Bruising the joint which causes soreness (inflammation) in the joint.  Wear and tear on the joints which occur as we grow older (osteoarthritis).  Overusing the joint.  Various forms of arthritis.  Infections of the joint. Regardless of the cause of pain in your joint, most of these different pains respond to anti-inflammatory drugs and rest. The exception to this is when a joint is infected, and these cases are treated with antibiotics, if it is a bacterial infection. HOME CARE INSTRUCTIONS   Rest the injured area for as long as directed by your caregiver. Then slowly start using the joint as directed by your caregiver and as the pain allows. Crutches as directed may be useful if the ankles, knees or hips are involved. If the knee was splinted or casted, continue use and care as directed. If an stretchy or elastic wrapping bandage has been applied today, it should be removed and re-applied every 3 to 4 hours. It should not be applied tightly, but firmly enough to keep swelling down. Watch toes and feet for swelling, bluish discoloration, coldness, numbness or excessive pain. If any of these problems (symptoms) occur, remove the ace bandage and re-apply more loosely. If these symptoms persist, contact your caregiver or return to this location.  For the first 24 hours, keep the injured  extremity elevated on pillows while lying down.  Apply ice for 15-20 minutes to the sore joint every couple hours while awake for the first half day. Then 03-04 times per day for the first 48 hours. Put the ice in a plastic bag and place a towel between the bag of ice and your skin.  Wear any splinting, casting, elastic bandage applications, or slings as instructed.  Only take over-the-counter or prescription medicines for pain, discomfort, or fever as directed by your caregiver. Do not use aspirin immediately after the injury unless instructed by your physician. Aspirin can cause increased bleeding and bruising of the tissues.  If you were given crutches, continue to use them as instructed and do not resume weight bearing on the sore joint until instructed. Persistent pain and inability to use the sore joint as directed for more than 2 to 3 days are warning signs indicating that you should see a caregiver for a follow-up visit as soon as possible. Initially, a hairline fracture (break in bone) may not be evident on X-rays. Persistent pain and swelling indicate that further evaluation, non-weight bearing or use of the joint (use of crutches or slings as instructed), or further X-rays are indicated. X-rays may sometimes not show a small fracture until a week or 10 days later. Make a follow-up appointment with your own caregiver or one to whom we have referred you. A radiologist (specialist in reading X-rays) may read your X-rays. Make sure you know how you are to obtain your X-ray results. Do not assume everything is  normal if you do not hear from us. SEEK MEDICAL CARE IF: Bruising, swelling, or pain increases. SEEK IMMEDIATE MEDICAL CARE IF:   Your fingers or toes are numb or blue.  The pain is not responding to medications and continues to stay the same or get worse.  The pain in your joint becomes severe.  You develop a fever over 102 F (38.9 C).  It becomes impossible to move or use the  joint. MAKE SURE YOU:   Understand these instructions.  Will watch your condition.  Will get help right away if you are not doing well or get worse. Document Released: 01/18/2005 Document Revised: 04/12/2011 Document Reviewed: 09/06/2007 [facility] Patient Information 2014 [employer], [place].

## 2013-06-25 NOTE — ED Notes (Signed)
Initial Contact - pt sitting in chair with family at bedside, c/o pain/numbness to R hand.  +swelling noted.  Pt moving extremity without issue.  Ice provided.  Pt c/o nausea but asking for something to drink.  Pt aware that she needs to be seen by EDPA first, agreeable with plan. A+Ox4. Ambulatory with steady gait.  NAD.

## 2013-06-25 NOTE — ED Notes (Signed)
Pt sts "pissed off" that EDPA wouldn't order her a pain prescription or give her "a shot in the ass of phenergan", (per family, "so she can be sedated") or that she was waiting for ortho tech to place splint.  Pt aware that ortho tech is with another pt and will be to her room ASAP.  EDPA aware of pt/family comments.  No change in orders.

## 2013-08-21 ENCOUNTER — Encounter (HOSPITAL_BASED_OUTPATIENT_CLINIC_OR_DEPARTMENT_OTHER): Payer: Self-pay | Admitting: *Deleted

## 2013-08-21 NOTE — Progress Notes (Signed)
Pt has chronic pain-has multiple er visits-played tennis since 48 yr old-sees a pain specialist-said she may have to stay overnight due to her pain meds-to bring all meds and overnight bag-no htn-but on minipress hs-will come in for bmet-had ekg2/15

## 2013-08-22 ENCOUNTER — Encounter (HOSPITAL_BASED_OUTPATIENT_CLINIC_OR_DEPARTMENT_OTHER)
Admission: RE | Admit: 2013-08-22 | Discharge: 2013-08-22 | Disposition: A | Discharge status: Home / Self Care | Payer: 59 | Source: Ambulatory Visit | Attending: Orthopedic Surgery | Admitting: Orthopedic Surgery

## 2013-08-22 DIAGNOSIS — F17200 Nicotine dependence, unspecified, uncomplicated: Secondary | ICD-10-CM | POA: Diagnosis not present

## 2013-08-22 DIAGNOSIS — Z880 Allergy status to penicillin: Secondary | ICD-10-CM | POA: Diagnosis not present

## 2013-08-22 DIAGNOSIS — E079 Disorder of thyroid, unspecified: Secondary | ICD-10-CM | POA: Diagnosis not present

## 2013-08-22 DIAGNOSIS — F411 Generalized anxiety disorder: Secondary | ICD-10-CM | POA: Diagnosis not present

## 2013-08-22 DIAGNOSIS — K2940 Chronic atrophic gastritis without bleeding: Secondary | ICD-10-CM | POA: Diagnosis not present

## 2013-08-22 DIAGNOSIS — M24873 Other specific joint derangements of unspecified ankle, not elsewhere classified: Secondary | ICD-10-CM | POA: Diagnosis not present

## 2013-08-22 DIAGNOSIS — Z79899 Other long term (current) drug therapy: Secondary | ICD-10-CM | POA: Diagnosis not present

## 2013-08-22 DIAGNOSIS — M24876 Other specific joint derangements of unspecified foot, not elsewhere classified: Secondary | ICD-10-CM | POA: Diagnosis present

## 2013-08-22 DIAGNOSIS — F1110 Opioid abuse, uncomplicated: Secondary | ICD-10-CM | POA: Diagnosis not present

## 2013-08-22 DIAGNOSIS — Z01812 Encounter for preprocedural laboratory examination: Secondary | ICD-10-CM | POA: Diagnosis not present

## 2013-08-22 DIAGNOSIS — X503XXA Overexertion from repetitive movements, initial encounter: Secondary | ICD-10-CM | POA: Diagnosis not present

## 2013-08-22 DIAGNOSIS — K219 Gastro-esophageal reflux disease without esophagitis: Secondary | ICD-10-CM | POA: Diagnosis not present

## 2013-08-23 ENCOUNTER — Other Ambulatory Visit: Payer: Self-pay | Admitting: Orthopedic Surgery

## 2013-08-24 ENCOUNTER — Ambulatory Visit (HOSPITAL_COMMUNITY): Payer: 59

## 2013-08-24 ENCOUNTER — Encounter (HOSPITAL_BASED_OUTPATIENT_CLINIC_OR_DEPARTMENT_OTHER): Payer: Self-pay

## 2013-08-24 ENCOUNTER — Ambulatory Visit (HOSPITAL_BASED_OUTPATIENT_CLINIC_OR_DEPARTMENT_OTHER): Payer: 59 | Admitting: Certified Registered"

## 2013-08-24 ENCOUNTER — Encounter (HOSPITAL_BASED_OUTPATIENT_CLINIC_OR_DEPARTMENT_OTHER): Payer: 59 | Admitting: Certified Registered"

## 2013-08-24 ENCOUNTER — Encounter (HOSPITAL_BASED_OUTPATIENT_CLINIC_OR_DEPARTMENT_OTHER)
Admission: RE | Disposition: A | Discharge status: Home / Self Care | Payer: Self-pay | Source: Ambulatory Visit | Attending: Orthopedic Surgery

## 2013-08-24 ENCOUNTER — Ambulatory Visit (HOSPITAL_BASED_OUTPATIENT_CLINIC_OR_DEPARTMENT_OTHER)
Admission: RE | Admit: 2013-08-24 | Discharge: 2013-08-24 | Disposition: A | Discharge status: Home / Self Care | Payer: 59 | Source: Ambulatory Visit | Attending: Orthopedic Surgery | Admitting: Orthopedic Surgery

## 2013-08-24 DIAGNOSIS — M24876 Other specific joint derangements of unspecified foot, not elsewhere classified: Principal | ICD-10-CM

## 2013-08-24 DIAGNOSIS — Z79899 Other long term (current) drug therapy: Secondary | ICD-10-CM | POA: Insufficient documentation

## 2013-08-24 DIAGNOSIS — K219 Gastro-esophageal reflux disease without esophagitis: Secondary | ICD-10-CM | POA: Insufficient documentation

## 2013-08-24 DIAGNOSIS — Z01812 Encounter for preprocedural laboratory examination: Secondary | ICD-10-CM | POA: Insufficient documentation

## 2013-08-24 DIAGNOSIS — M24873 Other specific joint derangements of unspecified ankle, not elsewhere classified: Secondary | ICD-10-CM | POA: Diagnosis not present

## 2013-08-24 DIAGNOSIS — F1110 Opioid abuse, uncomplicated: Secondary | ICD-10-CM | POA: Insufficient documentation

## 2013-08-24 DIAGNOSIS — X58XXXA Exposure to other specified factors, initial encounter: Secondary | ICD-10-CM

## 2013-08-24 DIAGNOSIS — E079 Disorder of thyroid, unspecified: Secondary | ICD-10-CM | POA: Insufficient documentation

## 2013-08-24 DIAGNOSIS — F411 Generalized anxiety disorder: Secondary | ICD-10-CM | POA: Insufficient documentation

## 2013-08-24 DIAGNOSIS — Z880 Allergy status to penicillin: Secondary | ICD-10-CM | POA: Insufficient documentation

## 2013-08-24 DIAGNOSIS — F17200 Nicotine dependence, unspecified, uncomplicated: Secondary | ICD-10-CM | POA: Insufficient documentation

## 2013-08-24 DIAGNOSIS — K2940 Chronic atrophic gastritis without bleeding: Secondary | ICD-10-CM | POA: Insufficient documentation

## 2013-08-24 DIAGNOSIS — X503XXA Overexertion from repetitive movements, initial encounter: Secondary | ICD-10-CM | POA: Insufficient documentation

## 2013-08-24 HISTORY — DX: Other chronic pain: G89.29

## 2013-08-24 HISTORY — DX: Insomnia, unspecified: G47.00

## 2013-08-24 HISTORY — DX: Unspecified chronic gastritis without bleeding: K29.50

## 2013-08-24 HISTORY — DX: Gastro-esophageal reflux disease without esophagitis: K21.9

## 2013-08-24 HISTORY — PX: ANKLE RECONSTRUCTION: SHX1151

## 2013-08-24 HISTORY — DX: Nausea with vomiting, unspecified: R11.2

## 2013-08-24 HISTORY — DX: Other specified postprocedural states: Z98.890

## 2013-08-24 LAB — POCT I-STAT, CHEM 8
BUN: 13 mg/dL (ref 6–23)
CALCIUM ION: 1.17 mmol/L (ref 1.12–1.23)
CREATININE: 0.7 mg/dL (ref 0.50–1.10)
Chloride: 102 mEq/L (ref 96–112)
GLUCOSE: 94 mg/dL (ref 70–99)
HCT: 43 % (ref 36.0–46.0)
HEMOGLOBIN: 14.6 g/dL (ref 12.0–15.0)
Potassium: 3.9 mEq/L (ref 3.7–5.3)
Sodium: 136 mEq/L — ABNORMAL LOW (ref 137–147)
TCO2: 25 mmol/L (ref 0–100)

## 2013-08-24 SURGERY — RECONSTRUCTION, ANKLE
Anesthesia: General | Site: Ankle | Laterality: Right

## 2013-08-24 MED ORDER — CHLORHEXIDINE GLUCONATE 4 % EX LIQD
60.0000 mL | Freq: Once | CUTANEOUS | Status: DC
Start: 2013-08-24 — End: 2013-08-24

## 2013-08-24 MED ORDER — MIDAZOLAM HCL 2 MG/2ML IJ SOLN
INTRAMUSCULAR | Status: AC
  Filled 2013-08-24: qty 2

## 2013-08-24 MED ORDER — DEXAMETHASONE SODIUM PHOSPHATE 10 MG/ML IJ SOLN
INTRAMUSCULAR | Status: AC
  Filled 2013-08-24: qty 1

## 2013-08-24 MED ORDER — FENTANYL CITRATE 0.05 MG/ML IJ SOLN
INTRAMUSCULAR | Status: DC | PRN
  Administered 2013-08-24: 50 ug via INTRAVENOUS

## 2013-08-24 MED ORDER — FENTANYL CITRATE 0.05 MG/ML IJ SOLN
25.0000 ug | INTRAMUSCULAR | Status: DC | PRN
  Administered 2013-08-24 (×2): 25 ug via INTRAVENOUS

## 2013-08-24 MED ORDER — CLINDAMYCIN PHOSPHATE 300 MG/50ML IV SOLN
INTRAVENOUS | Status: AC
Start: 2013-08-24 — End: 2013-08-24
  Filled 2013-08-24: qty 50

## 2013-08-24 MED ORDER — DEXMEDETOMIDINE BOLUS VIA INFUSION: 0.7000 ug/kg | Freq: Once | INTRAVENOUS | Status: DC

## 2013-08-24 MED ORDER — OXYCODONE HCL 10 MG PO TABS: 10.0000 mg | ORAL_TABLET | Freq: Four times a day (QID) | ORAL | Status: DC | PRN

## 2013-08-24 MED ORDER — SCOPOLAMINE 1 MG/3DAYS TD PT72
MEDICATED_PATCH | TRANSDERMAL | Status: AC
Start: 2013-08-24 — End: 2013-08-24
  Filled 2013-08-24: qty 1

## 2013-08-24 MED ORDER — OXYCODONE HCL 5 MG/5ML PO SOLN: 5.0000 mg | Freq: Once | ORAL | Status: AC | PRN

## 2013-08-24 MED ORDER — MIDAZOLAM HCL 2 MG/2ML IJ SOLN
1.0000 mg | INTRAMUSCULAR | Status: DC | PRN
  Administered 2013-08-24 (×2): 2 mg via INTRAVENOUS

## 2013-08-24 MED ORDER — PROPOFOL 10 MG/ML IV BOLUS
INTRAVENOUS | Status: DC | PRN
  Administered 2013-08-24: 200 mg via INTRAVENOUS

## 2013-08-24 MED ORDER — CLINDAMYCIN PHOSPHATE 300 MG/50ML IV SOLN
300.0000 mg | INTRAVENOUS | Status: AC
Start: 2013-08-24 — End: 2013-08-24
  Administered 2013-08-24: 300 mg via INTRAVENOUS

## 2013-08-24 MED ORDER — OXYCODONE HCL 5 MG PO TABS
5.0000 mg | ORAL_TABLET | Freq: Once | ORAL | Status: AC | PRN
  Administered 2013-08-24: 5 mg via ORAL

## 2013-08-24 MED ORDER — LIDOCAINE HCL (CARDIAC) 20 MG/ML IV SOLN
INTRAVENOUS | Status: DC | PRN
  Administered 2013-08-24: 30 mg via INTRAVENOUS

## 2013-08-24 MED ORDER — FENTANYL CITRATE 0.05 MG/ML IJ SOLN
INTRAMUSCULAR | Status: AC
  Filled 2013-08-24: qty 2

## 2013-08-24 MED ORDER — MIDAZOLAM HCL 2 MG/2ML IJ SOLN: 2.0000 mg | Freq: Once | INTRAMUSCULAR | Status: DC

## 2013-08-24 MED ORDER — FENTANYL CITRATE 0.05 MG/ML IJ SOLN
INTRAMUSCULAR | Status: AC
  Filled 2013-08-24: qty 6

## 2013-08-24 MED ORDER — OXYCODONE HCL 5 MG PO TABS
ORAL_TABLET | ORAL | Status: AC
  Filled 2013-08-24: qty 1

## 2013-08-24 MED ORDER — KETOROLAC TROMETHAMINE 30 MG/ML IJ SOLN: 15.0000 mg | Freq: Once | INTRAMUSCULAR | Status: DC | PRN

## 2013-08-24 MED ORDER — DEXAMETHASONE SODIUM PHOSPHATE 4 MG/ML IJ SOLN
4.0000 mg | Freq: Once | INTRAMUSCULAR | Status: AC
  Administered 2013-08-24: 4 mg via INTRAVENOUS

## 2013-08-24 MED ORDER — DEXAMETHASONE SODIUM PHOSPHATE 4 MG/ML IJ SOLN
INTRAMUSCULAR | Status: DC | PRN
  Administered 2013-08-24: 4 mg via INTRAVENOUS

## 2013-08-24 MED ORDER — PROMETHAZINE HCL 25 MG/ML IJ SOLN
INTRAMUSCULAR | Status: DC | PRN
  Administered 2013-08-24: 25 mg via INTRAVENOUS

## 2013-08-24 MED ORDER — LACTATED RINGERS IV SOLN
INTRAVENOUS | Status: DC
  Administered 2013-08-24 (×2): via INTRAVENOUS

## 2013-08-24 MED ORDER — MIDAZOLAM HCL 5 MG/5ML IJ SOLN
INTRAMUSCULAR | Status: DC | PRN
  Administered 2013-08-24 (×2): 1 mg via INTRAVENOUS

## 2013-08-24 MED ORDER — DROPERIDOL 2.5 MG/ML IJ SOLN: 0.6250 mg | INTRAMUSCULAR | Status: DC | PRN

## 2013-08-24 MED ORDER — FENTANYL CITRATE 0.05 MG/ML IJ SOLN
50.0000 ug | INTRAMUSCULAR | Status: DC | PRN
  Administered 2013-08-24 (×2): 100 ug via INTRAVENOUS

## 2013-08-24 MED ORDER — PROMETHAZINE HCL 25 MG/ML IJ SOLN
INTRAMUSCULAR | Status: AC
Start: 2013-08-24 — End: 2013-08-24
  Filled 2013-08-24: qty 1

## 2013-08-24 SURGICAL SUPPLY — 75 items
BANDAGE ELASTIC 3 VELCRO ST LF (GAUZE/BANDAGES/DRESSINGS) ×3 IMPLANT
BANDAGE ELASTIC 4 VELCRO ST LF (GAUZE/BANDAGES/DRESSINGS) ×6 IMPLANT
BANDAGE ESMARK 6X9 LF (GAUZE/BANDAGES/DRESSINGS) ×1 IMPLANT
BENZOIN TINCTURE PRP APPL 2/3 (GAUZE/BANDAGES/DRESSINGS) ×3 IMPLANT
BLADE SURG 15 STRL LF DISP TIS (BLADE) ×1 IMPLANT
BLADE SURG 15 STRL SS (BLADE) ×2
BNDG ESMARK 4X9 LF (GAUZE/BANDAGES/DRESSINGS) IMPLANT
BNDG ESMARK 6X9 LF (GAUZE/BANDAGES/DRESSINGS) ×3
CANISTER SUCT 1200ML W/VALVE (MISCELLANEOUS) IMPLANT
CLOSURE WOUND 1/2 X4 (GAUZE/BANDAGES/DRESSINGS) ×1
COVER TABLE BACK 60X90 (DRAPES) ×3 IMPLANT
DECANTER SPIKE VIAL GLASS SM (MISCELLANEOUS) IMPLANT
DRAIN PENROSE 1/4X12 LTX STRL (WOUND CARE) IMPLANT
DRAPE EXTREMITY T 121X128X90 (DRAPE) ×3 IMPLANT
DRAPE OEC MINIVIEW 54X84 (DRAPES) IMPLANT
DRAPE U 20/CS (DRAPES) ×3 IMPLANT
DRAPE U-SHAPE 47X51 STRL (DRAPES) ×3 IMPLANT
DRSG EMULSION OIL 3X3 NADH (GAUZE/BANDAGES/DRESSINGS) IMPLANT
DURAPREP 26ML APPLICATOR (WOUND CARE) ×3 IMPLANT
ELECT NEEDLE TIP 2.8 STRL (NEEDLE) ×3 IMPLANT
ELECT REM PT RETURN 9FT ADLT (ELECTROSURGICAL) ×3
ELECTRODE REM PT RTRN 9FT ADLT (ELECTROSURGICAL) ×1 IMPLANT
GAUZE SPONGE 4X4 12PLY STRL (GAUZE/BANDAGES/DRESSINGS) ×3 IMPLANT
GAUZE XEROFORM 1X8 LF (GAUZE/BANDAGES/DRESSINGS) ×3 IMPLANT
GLOVE BIOGEL PI IND STRL 7.0 (GLOVE) ×1 IMPLANT
GLOVE BIOGEL PI IND STRL 8 (GLOVE) ×2 IMPLANT
GLOVE BIOGEL PI INDICATOR 7.0 (GLOVE) ×2
GLOVE BIOGEL PI INDICATOR 8 (GLOVE) ×4
GLOVE ECLIPSE 7.5 STRL STRAW (GLOVE) ×6 IMPLANT
GLOVE SURG SS PI 7.0 STRL IVOR (GLOVE) ×3 IMPLANT
GLOVE SURG SS PI 7.5 STRL IVOR (GLOVE) ×6 IMPLANT
GOWN STRL REUS W/TWL XL LVL3 (GOWN DISPOSABLE) ×6 IMPLANT
KIT MINI BIO ANCHOR DRILL (KITS) IMPLANT
NEEDLE 1/2 CIR CATGUT .05X1.09 (NEEDLE) IMPLANT
NEEDLE ADDISON D1/2 CIR (NEEDLE) IMPLANT
NEEDLE HYPO 25X1 1.5 SAFETY (NEEDLE) IMPLANT
NS IRRIG 1000ML POUR BTL (IV SOLUTION) ×3 IMPLANT
PACK BASIN DAY SURGERY FS (CUSTOM PROCEDURE TRAY) ×3 IMPLANT
PAD CAST 3X4 CTTN HI CHSV (CAST SUPPLIES) ×2 IMPLANT
PAD CAST 4YDX4 CTTN HI CHSV (CAST SUPPLIES) ×2 IMPLANT
PADDING CAST ABS 4INX4YD NS (CAST SUPPLIES)
PADDING CAST ABS COTTON 4X4 ST (CAST SUPPLIES) IMPLANT
PADDING CAST COTTON 3X4 STRL (CAST SUPPLIES) ×4
PADDING CAST COTTON 4X4 STRL (CAST SUPPLIES) ×4
PENCIL BUTTON HOLSTER BLD 10FT (ELECTRODE) ×3 IMPLANT
SHEET MEDIUM DRAPE 40X70 STRL (DRAPES) IMPLANT
SPLINT FAST PLASTER 5X30 (CAST SUPPLIES)
SPLINT FIBERGLASS 4X30 (CAST SUPPLIES) ×3 IMPLANT
SPLINT PLASTER CAST FAST 5X30 (CAST SUPPLIES) IMPLANT
STOCKINETTE 6  STRL (DRAPES) ×2
STOCKINETTE 6 STRL (DRAPES) ×1 IMPLANT
STRIP CLOSURE SKIN 1/2X4 (GAUZE/BANDAGES/DRESSINGS) ×2 IMPLANT
SUCTION FRAZIER TIP 10 FR DISP (SUCTIONS) ×3 IMPLANT
SUT ETHIBOND 0 MO6 C/R (SUTURE) IMPLANT
SUT ETHIBOND 3-0 V-5 (SUTURE) IMPLANT
SUT ETHILON 3 0 PS 1 (SUTURE) IMPLANT
SUT ETHILON 4 0 PS 2 18 (SUTURE) IMPLANT
SUT FIBERWIRE 3-0 18 TAPR NDL (SUTURE) ×9
SUT MNCRL AB 3-0 PS2 18 (SUTURE) ×3 IMPLANT
SUT VIC AB 0 CT3 27 (SUTURE) IMPLANT
SUT VIC AB 0 SH 27 (SUTURE) IMPLANT
SUT VIC AB 2-0 PS2 27 (SUTURE) IMPLANT
SUT VIC AB 2-0 SH 27 (SUTURE)
SUT VIC AB 2-0 SH 27XBRD (SUTURE) IMPLANT
SUT VIC AB 3-0 FS2 27 (SUTURE) ×3 IMPLANT
SUT VIC AB 4-0 P-3 18XBRD (SUTURE) IMPLANT
SUT VIC AB 4-0 P3 18 (SUTURE)
SUT VICRYL 4-0 PS2 18IN ABS (SUTURE) IMPLANT
SUTURE FIBERWR 3-0 18 TAPR NDL (SUTURE) ×3 IMPLANT
SYR BULB 3OZ (MISCELLANEOUS) ×3 IMPLANT
SYRINGE CONTROL L 12CC (SYRINGE) IMPLANT
TOWEL OR 17X24 6PK STRL BLUE (TOWEL DISPOSABLE) ×3 IMPLANT
TUBE CONNECTING 20'X1/4 (TUBING) ×1
TUBE CONNECTING 20X1/4 (TUBING) ×2 IMPLANT
UNDERPAD 30X30 INCONTINENT (UNDERPADS AND DIAPERS) ×3 IMPLANT

## 2013-08-24 NOTE — Anesthesia Preprocedure Evaluation (Signed)
Anesthesia Evaluation  Patient identified by MRN, date of birth, ID band Patient awake    Reviewed: Allergy & Precautions, H&P , NPO status , Patient's Chart, lab work & pertinent test results  History of Anesthesia Complications (+) PONV  Airway Mallampati: II TM Distance: >3 FB Neck ROM: Full    Dental  (+) Teeth Intact, Dental Advisory Given   Pulmonary Current Smoker,  breath sounds clear to auscultation        Cardiovascular Rhythm:Regular     Neuro/Psych    GI/Hepatic   Endo/Other    Renal/GU      Musculoskeletal   Abdominal   Peds  Hematology   Anesthesia Other Findings   Reproductive/Obstetrics                           Anesthesia Physical Anesthesia Plan  ASA: II  Anesthesia Plan: General   Post-op Pain Management: MAC Combined w/ Regional for Post-op pain   Induction: Intravenous  Airway Management Planned: LMA  Additional Equipment:   Intra-op Plan:   Post-operative Plan:   Informed Consent: I have reviewed the patients History and Physical, chart, labs and discussed the procedure including the risks, benefits and alternatives for the proposed anesthesia with the patient or authorized representative who has indicated his/her understanding and acceptance.   Dental advisory given  Plan Discussed with: Anesthesiologist and CRNA  Anesthesia Plan Comments: (R. Ankle arthritis Chronic pain with narcotic )        Anesthesia Quick Evaluation

## 2013-08-24 NOTE — Anesthesia Procedure Notes (Addendum)
Procedure Name: LMA Insertion Date/Time: 08/24/2013 12:34 PM Performed by: BLOCKER, TIMOTHY Pre-anesthesia Checklist: Patient identified, Emergency Drugs available, Suction available and Patient being monitored Patient Re-evaluated:Patient Re-evaluated prior to inductionOxygen Delivery Method: Circle System Utilized Preoxygenation: Pre-oxygenation with 100% oxygen Intubation Type: IV induction Ventilation: Mask ventilation without difficulty LMA: LMA inserted LMA Size: 4.0 Number of attempts: 1 Airway Equipment and Method: bite block Placement Confirmation: positive ETCO2 Tube secured with: Tape Dental Injury: Teeth and Oropharynx as per pre-operative assessment    Anesthesia Regional Block:  Popliteal block  Pre-Anesthetic Checklist: ,, timeout performed, Correct Patient, Correct Site, Correct Laterality, Correct Procedure, Correct Position, site marked, Risks and benefits discussed,  Surgical consent,  Pre-op evaluation,  At surgeon's request and post-op pain management  Laterality: Right  Prep: chloraprep       Needles:  Injection technique: Single-shot  Needle Type: Echogenic Stimulator Needle     Needle Length: 9cm 9 cm Needle Gauge: 22 and 22 G    Additional Needles:  Procedures: ultrasound guided (picture in chart) Popliteal block Narrative:  Start time: 08/24/2013 11:40 AM End time: 08/24/2013 11:45 AM Injection made incrementally with aspirations every 5 mL.  Performed by: Personally   Additional Notes: 30 cc 0.5% Marcaine with 1:200 Epi injected easily

## 2013-08-24 NOTE — H&P (Signed)
PREOPERATIVE H&P  Chief Complaint: r ankle instability  HPI: [name] RotaKaren Dannenberg is a 48 y.o. female who presents for evaluation of r ankle instability. It has been present for 4 months and has been worsening. She has failed conservative measures. Pain is rated as moderate.  Past Medical History  Diagnosis Date  . Anxiety   . Thyroid disease   . Substance abuse   . Pneumonia   . Bronchitis   . Kidney stone   . Endometriosis   . Chronic pain   . GERD (gastroesophageal reflux disease)   . Gastritis, chronic   . PONV (postoperative nausea and vomiting)   . Insomnia    Past Surgical History  Procedure Laterality Date  . Abdominal hysterectomy    . Cholecystectomy    . Appendectomy    . Ex laporotomy      x2-adhesion  . Knee arthroplasty      left knee age 89  . Finger arthrodesis      left ring   History   Social History  . Marital Status: Divorced    Spouse Name: N/A    Number of Children: N/A  . Years of Education: N/A   Social History Main Topics  . Smoking status: Current Every Day Smoker -- 0.50 packs/day  . Smokeless tobacco: Never Used  . Alcohol Use: No     Comment: socially-no now  . Drug Use: Yes    Special: Opium     Comment: hx narcotic abuse  . Sexual Activity: Yes    Birth Control/ Protection: None   Other Topics Concern  . None   Social History Narrative  . None   Family History  Problem Relation Age of Onset  . Thyroid disease Mother   . CAD Mother   . Thyroid disease Sister   . CAD Other    Allergies  Allergen Reactions  . Sulfa Antibiotics Anaphylaxis  . Compazine [Prochlorperazine Edisylate]     Restless leg  . Latex Hives  . Reglan [Metoclopramide] Nausea And Vomiting and Other (See Comments)    hallucinations   . Zofran [Ondansetron Hcl] Hives and Nausea And Vomiting    Can have benadryl with it   . Clindamycin/Lincomycin Nausea Only and Other (See Comments)    GI upset  . Morphine And Related Rash    Can have with benadryl  .  Penicillins Hives, Nausea Only and Rash   Prior to Admission medications   Medication Sig Start Date End Date Taking? Authorizing Provider  acetaminophen (TYLENOL) 500 MG tablet Take 500 mg by mouth every 6 (six) hours as needed for mild pain or moderate pain.    Yes Historical Provider, MD  Buprenorphine HCl-Naloxone HCl (SUBOXONE) 8-2 MG FILM Place under the tongue 3 (three) times daily.   Yes Historical Provider, MD  clonazePAM (KLONOPIN) 1 MG tablet Take 0.5-1 mg by mouth 2 (two) times daily. 0.5mg   as needed for anxiety   Yes Historical Provider, MD  diphenhydrAMINE (BENADRYL) 50 MG capsule Take 50 mg by mouth every 6 (six) hours as needed for sleep.   Yes Historical Provider, MD  omeprazole (PRILOSEC OTC) 20 MG tablet Take 20 mg by mouth daily.   Yes Historical Provider, MD  prazosin (MINIPRESS) 1 MG capsule Take 1 mg by mouth at bedtime. Total 3=3mg    Yes Historical Provider, MD  promethazine (PHENERGAN) 25 MG suppository Place 1 suppository (25 mg total) rectally every 8 (eight) hours as needed for nausea or vomiting. 05/17/13  Yes  Suzi Roots, MD  sucralfate (CARAFATE) 1 G tablet Take 1 tablet (1 g total) by mouth 4 (four) times daily -  with meals and at bedtime. 03/24/13  Yes [name], MD  promethazine (PHENERGAN) 25 MG tablet Take 25 mg by mouth every 6 (six) hours as needed for nausea or vomiting.    Historical Provider, MD     Positive ROS: none  All other systems have been reviewed and were otherwise negative with the exception of those mentioned in the HPI and as above.  Physical Exam: Filed Vitals:   08/24/13 1200  BP: 98/54  Pulse: 80  Temp:   Resp: 18    General: Alert, no acute distress Cardiovascular: No pedal edema Respiratory: No cyanosis, no use of accessory musculature GI: No organomegaly, abdomen is soft and non-tender Skin: No lesions in the area of chief complaint Neurologic: Sensation intact distally Psychiatric: Patient is competent for  consent with normal mood and affect Lymphatic: No axillary or cervical lymphadenopathy  MUSCULOSKELETAL: r ankle: + instability +ant drawer  Assessment/Plan: right ankle instabillity Plan for Procedure(s): RECONSTRUCTION ANKLE  The risks benefits and alternatives were discussed with the patient including but not limited to the risks of nonoperative treatment, versus surgical intervention including infection, bleeding, nerve injury, malunion, nonunion, hardware prominence, hardware failure, need for hardware removal, blood clots, cardiopulmonary complications, morbidity, mortality, among others, and they were willing to proceed.  Predicted outcome is good, although there will be at least a six to nine month expected recovery.  Harvie Junior, MD 08/24/2013 12:41 PM

## 2013-08-24 NOTE — Op Note (Signed)
08/24/2013  1:46 PM  PATIENT:  Carolee RotaKaren Hefter  48 y.o. female  PRE-OPERATIVE DIAGNOSIS:  right ankle instabillity  POST-OPERATIVE DIAGNOSIS:  right ankle instabillity  PROCEDURE:  Procedure(s): RECONSTRUCTION ANKLE (Right)  SURGEON:  Surgeon(s) and Role:    * Harvie JuniorJohn L Tameem Pullara, MD - Primary  PHYSICIAN ASSISTANT:   ASSISTANTS: betnuner   ANESTHESIA:   general  EBL:  Total I/O In: 1400 [I.V.:1400] Out: -   BLOOD ADMINISTERED:none  DRAINS: none   LOCAL MEDICATIONS USED:  MARCAINE     SPECIMEN:  No Specimen  DISPOSITION OF SPECIMEN:  N/A  COUNTS:  YES  TOURNIQUET:   Total Tourniquet Time Documented: Calf (Right) - 34 minutes Total: Calf (Right) - 34 minutes   DICTATION: .Other Dictation: Dictation Number [id number]  PLAN OF CARE: Discharge to home after PACU  PATIENT DISPOSITION:  PACU - hemodynamically stable.   Delay start of Pharmacological VTE agent (>24hrs) due to surgical blood loss or risk of bleeding: no

## 2013-08-24 NOTE — Discharge Instructions (Signed)
Ambulate with crutches Non weight bearing on the right. Ice and elevate your right leg as much as possible.  Discharge Instructions After Orthopedic Procedures:  *You may feel tired and weak following your procedure. It is recommended that you limit physical activity for the next 24 hours and rest at home for the remainder of today and tomorrow. *No strenuous activity should be started without your doctor's permission.  Elevate the extremity that you had surgery on to a level above your heart. This should continue for 48 hours or as instructed by your doctor.  If you had hand, arm or shoulder surgery you should move your fingers frequently unless otherwise instructed by your doctor.  If you had foot, knee or leg surgery you should wiggle your toes frequently unless otherwise instructed by your doctor.  Follow your doctor's exact instructions for activity at home. Use your home equipment as instructed. (Crutches, hard shoes, slings etc.)  Limit your activity as instructed by your doctor.  Report to your doctor should any of the following occur: 1. Extreme swelling of your fingers or toes. 2. Inability to wiggle your fingers or toes. 3. Coldness, pale or bluish color in your fingers or toes. 4. Loss of sensation, numbness or tingling of your fingers or toes. 5. Unusual smell or odor from under your dressing or cast. 6. Excessive bleeding or drainage from the surgical site. 7. Pain not relieved by medication your doctor has prescribed for you. 8. Cast or dressing too tight (do not get your dressing or cast wet or put anything under          your dressing or cast.)  *Do not change your dressing unless instructed by your doctor or discharge nurse. Then follow exact instructions.  *Follow labeled instructions for any medications that your doctor may have prescribed for you. *Should any questions or complications develop following your procedure, PLEASE CONTACT YOUR DOCTOR.     Regional  Anesthesia Blocks  1. Numbness or the inability to move the "blocked" extremity may last from 3-48 hours after placement. The length of time depends on the medication injected and your individual response to the medication. If the numbness is not going away after 48 hours, call your surgeon.  2. The extremity that is blocked will need to be protected until the numbness is gone and the  Strength has returned. Because you cannot feel it, you will need to take extra care to avoid injury. Because it may be weak, you may have difficulty moving it or using it. You may not know what position it is in without looking at it while the block is in effect.  3. For blocks in the legs and feet, returning to weight bearing and walking needs to be done carefully. You will need to wait until the numbness is entirely gone and the strength has returned. You should be able to move your leg and foot normally before you try and bear weight or walk. You will need someone to be with you when you first try to ensure you do not fall and possibly risk injury.  4. Bruising and tenderness at the needle site are common side effects and will resolve in a few days.  5. Persistent numbness or new problems with movement should be communicated to the surgeon or the [facility] [phone])/ [facility] [phone]).  Post Anesthesia Home Care Instructions  Activity: Get plenty of rest for the remainder of the day. A responsible adult should stay  with you for 24 hours following the procedure.  For the next 24 hours, DO NOT: -Drive a car -Advertising copywriter -Drink alcoholic beverages -Take any medication unless instructed by your physician -Make any legal decisions or sign important papers.  Meals: Start with liquid foods such as gelatin or soup. Progress to regular foods as tolerated. Avoid greasy, spicy, heavy foods. If nausea and/or vomiting occur, drink only clear liquids until the nausea and/or  vomiting subsides. Call your physician if vomiting continues.  Special Instructions/Symptoms: Your throat may feel dry or sore from the anesthesia or the breathing tube placed in your throat during surgery. If this causes discomfort, gargle with warm salt water. The discomfort should disappear within 24 hours.

## 2013-08-24 NOTE — Progress Notes (Signed)
Assisted Dr. Joslin with right, ultrasound guided, popliteal block. Side rails up, monitors on throughout procedure. See vital signs in flow sheet. Tolerated Procedure well. 

## 2013-08-24 NOTE — Transfer of Care (Signed)
Immediate Anesthesia Transfer of Care Note  Patient: [name] RotaKaren [name]  Procedure(s) Performed: Procedure(s): RECONSTRUCTION ANKLE (Right)  Patient Location: PACU  Anesthesia Type:GA combined with regional for post-op pain  Level of Consciousness: awake, alert , oriented and patient cooperative  Airway & Oxygen Therapy: Patient Spontanous Breathing and Patient connected to face mask oxygen  Post-op Assessment: Report given to PACU RN and Post -op Vital signs reviewed and stable  Post vital signs: Reviewed and stable  Complications: No apparent anesthesia complications

## 2013-08-27 ENCOUNTER — Encounter (HOSPITAL_BASED_OUTPATIENT_CLINIC_OR_DEPARTMENT_OTHER): Payer: Self-pay | Admitting: Orthopedic Surgery

## 2013-08-27 NOTE — Anesthesia Postprocedure Evaluation (Signed)
  Anesthesia Post-op Note  Patient: Carolee RotaKaren Hanneman  Procedure(s) Performed: Procedure(s): RECONSTRUCTION ANKLE (Right)  Patient Location: PACU  Anesthesia Type:General and GA combined with regional for post-op pain  Level of Consciousness: awake, alert  and oriented  Airway and Oxygen Therapy: Patient Spontanous Breathing  Post-op Pain: mild  Post-op Assessment: Post-op Vital signs reviewed, Patient's Cardiovascular Status Stable, Respiratory Function Stable, Patent Airway and Pain level controlled  Post-op Vital Signs: stable  Last Vitals:  Filed Vitals:   08/24/13 1708  BP: 118/94  Pulse: 72  Temp:   Resp: 16    Complications: No apparent anesthesia complications

## 2013-08-27 NOTE — Op Note (Signed)
NAMCarolee Rota:  [name], [name] [name]                 ACCOUNT NO.:  [id number]  MEDICAL RECORD NO.:  [id number]  LOCATION:                               FACILITY:  [facility]  PHYSICIAN:  Harvie JuniorJohn L. Imani Sherrin, M.D.   DATE OF BIRTH:  [birth year]  DATE OF PROCEDURE:  08/24/2013 DATE OF DISCHARGE:  08/24/2013                              OPERATIVE REPORT   PREOPERATIVE DIAGNOSIS:  Anterior ankle instability, right.  POSTOPERATIVE DIAGNOSIS:  Anterior ankle instability, right.  PROCEDURE:  Right anterior ankle ligament reconstruction.  SURGEON:  Harvie JuniorJohn L. Cayley Pester, M.D.  ASSISTANT:  Marshia LyJames Bethune, P.A.  ANESTHESIA:  General.  BRIEF HISTORY:  Ms. [name] [name] is a 48 year old female with a twisting injury to her right ankle about 4 months ago.  She had had multiple recurrent episodes of this.  We examined her in  the office and tested side to side, she had a 5-degree difference of the talar tilt with stress testing.  She had significant pain.  We talked about treatment options. We talked about injection therapy, physical therapy, activity modification, our preference is to wait at least another several months to see if she would get better on her own.  She had some issues related to the needing to take a job and at this point was the right time for her to consider surgical intervention.  Certainly, she had the right problem and at that point, we talked about treatment options including the possibility of surgery and she elected to undergo anterior ligament reconstruction of ankle, and she was brought to the operating room for this procedure.  DESCRIPTION OF PROCEDURE:  The patient was brought to the operating room. After adequate anesthesia was obtained with general anesthetic, the patient was placed supine on the operating table.  The right ankle was then prepped draped in sterile fashion.  Following this, the leg was exsanguinated and tourniquet inflated to 250 mmHg.  Following this, a curved incision was made just  anterior to the ankle ligament structure, subcutaneous tissue down the level of the anterior capsule.  The capsule was divided along with the thickenings of the capsule at anterior talofibular ligament.  The calcaneofibular ligament were all way down were we could see the perineal tendons.  We then used five #2-0 FiberWire interrupted sutures, which gave excellent pants-over-vest repair with the ankle being held in an abducted and dorsiflexed position.  Excellent repair was achieved of the ligamentous structures of the ankle.  At that point, testing showed that her drawer was reduced and negative and there was no lateral opening.  At this point, the wound was copiously irrigated suctioned dry, closed in layers.  Sterile compressive dressing was applied as well as a U and a posterior splint with the foot and hind foot in abduction, and she was taken to recovery room and she was noted to be in satisfactory condition.  Estimated blood loss for the procedure was none.     Harvie JuniorJohn L. Breklyn Fabrizio, M.D.     Ranae PlumberJLG/MEDQ  D:  08/24/2013  T:  08/24/2013  Job:  [id number]

## 2013-09-08 ENCOUNTER — Encounter (HOSPITAL_COMMUNITY): Payer: Self-pay | Admitting: Emergency Medicine

## 2013-09-08 ENCOUNTER — Emergency Department (HOSPITAL_COMMUNITY)
Admission: EM | Admit: 2013-09-08 | Discharge: 2013-09-08 | Disposition: A | Discharge status: Home / Self Care | Payer: 59 | Attending: Emergency Medicine | Admitting: Emergency Medicine

## 2013-09-08 ENCOUNTER — Emergency Department (HOSPITAL_COMMUNITY): Payer: 59

## 2013-09-08 DIAGNOSIS — Z91040 Latex allergy status: Secondary | ICD-10-CM | POA: Diagnosis not present

## 2013-09-08 DIAGNOSIS — M79609 Pain in unspecified limb: Secondary | ICD-10-CM | POA: Insufficient documentation

## 2013-09-08 DIAGNOSIS — Z8701 Personal history of pneumonia (recurrent): Secondary | ICD-10-CM | POA: Insufficient documentation

## 2013-09-08 DIAGNOSIS — Z79899 Other long term (current) drug therapy: Secondary | ICD-10-CM | POA: Diagnosis not present

## 2013-09-08 DIAGNOSIS — Z87442 Personal history of urinary calculi: Secondary | ICD-10-CM | POA: Diagnosis not present

## 2013-09-08 DIAGNOSIS — G8918 Other acute postprocedural pain: Secondary | ICD-10-CM | POA: Insufficient documentation

## 2013-09-08 DIAGNOSIS — Z8639 Personal history of other endocrine, nutritional and metabolic disease: Secondary | ICD-10-CM | POA: Insufficient documentation

## 2013-09-08 DIAGNOSIS — R509 Fever, unspecified: Secondary | ICD-10-CM | POA: Insufficient documentation

## 2013-09-08 DIAGNOSIS — Z880 Allergy status to penicillin: Secondary | ICD-10-CM | POA: Insufficient documentation

## 2013-09-08 DIAGNOSIS — Z8742 Personal history of other diseases of the female genital tract: Secondary | ICD-10-CM | POA: Insufficient documentation

## 2013-09-08 DIAGNOSIS — Z9889 Other specified postprocedural states: Secondary | ICD-10-CM | POA: Insufficient documentation

## 2013-09-08 DIAGNOSIS — G8929 Other chronic pain: Secondary | ICD-10-CM | POA: Diagnosis not present

## 2013-09-08 DIAGNOSIS — Z862 Personal history of diseases of the blood and blood-forming organs and certain disorders involving the immune mechanism: Secondary | ICD-10-CM | POA: Diagnosis not present

## 2013-09-08 DIAGNOSIS — F17200 Nicotine dependence, unspecified, uncomplicated: Secondary | ICD-10-CM | POA: Insufficient documentation

## 2013-09-08 DIAGNOSIS — F411 Generalized anxiety disorder: Secondary | ICD-10-CM | POA: Diagnosis not present

## 2013-09-08 DIAGNOSIS — Z8709 Personal history of other diseases of the respiratory system: Secondary | ICD-10-CM | POA: Diagnosis not present

## 2013-09-08 DIAGNOSIS — R300 Dysuria: Secondary | ICD-10-CM | POA: Diagnosis not present

## 2013-09-08 DIAGNOSIS — K219 Gastro-esophageal reflux disease without esophagitis: Secondary | ICD-10-CM | POA: Insufficient documentation

## 2013-09-08 LAB — URINE MICROSCOPIC-ADD ON

## 2013-09-08 LAB — URINALYSIS, ROUTINE W REFLEX MICROSCOPIC
BILIRUBIN URINE: NEGATIVE
GLUCOSE, UA: NEGATIVE mg/dL
KETONES UR: NEGATIVE mg/dL
Nitrite: NEGATIVE
PH: 7 (ref 5.0–8.0)
Protein, ur: NEGATIVE mg/dL
Specific Gravity, Urine: 1.008 (ref 1.005–1.030)
Urobilinogen, UA: 0.2 mg/dL (ref 0.0–1.0)

## 2013-09-08 MED ORDER — OXYCODONE HCL ER 10 MG PO T12A: 10.0000 mg | EXTENDED_RELEASE_TABLET | Freq: Two times a day (BID) | ORAL | Status: DC

## 2013-09-08 MED ORDER — OXYCODONE-ACETAMINOPHEN 5-325 MG PO TABS: 2.0000 | ORAL_TABLET | Freq: Four times a day (QID) | ORAL | Status: DC | PRN

## 2013-09-08 MED ORDER — HYDROMORPHONE HCL PF 1 MG/ML IJ SOLN
1.0000 mg | Freq: Once | INTRAMUSCULAR | Status: AC
  Administered 2013-09-08: 1 mg via INTRAMUSCULAR
  Filled 2013-09-08: qty 1

## 2013-09-08 MED ORDER — PROMETHAZINE HCL 25 MG/ML IJ SOLN
25.0000 mg | Freq: Once | INTRAMUSCULAR | Status: AC
  Administered 2013-09-08: 25 mg via INTRAMUSCULAR
  Filled 2013-09-08: qty 1

## 2013-09-08 NOTE — ED Notes (Signed)
The pt is nhere because her pain med has run out.  She had rt  Ankle ligament tear surgery on July 24th.  She had her cast replaced on Monday and she was given oxycodone rx then but it ran out.  She reports that it is not enough

## 2013-09-08 NOTE — Discharge Instructions (Signed)
Pain Relief Preoperatively and Postoperatively °Being a good patient does not mean being a silent one. If you have questions, problems, or concerns about the pain you may feel after surgery, let your caregiver know. Patients have the right to assessment and management of pain. The treatment of pain after surgery is important to speed up recovery and return to normal activities. Severe pain after surgery, and the fear or anxiety associated with that pain, may cause extreme discomfort that: °· Prevents sleep. °· Decreases the ability to breathe deeply and cough. This can cause pneumonia or other upper airway infections. °· Causes your heart to beat faster and your blood pressure to be higher. °· Increases the risk for constipation and bloating. °· Decreases the ability of wounds to heal. °· May result in depression, increased anxiety, and feelings of helplessness. °Relief of pain before surgery is also important because it will lessen the pain after surgery. Patients who receive both pain relief before and after surgery experience greater pain relief than those who only receive pain relief after surgery. Let your caregiver know if you are having uncontrolled pain. This is very important. Pain after surgery is more difficult to manage if it is permitted to become severe, so prompt and adequate treatment of acute pain is necessary. °PAIN CONTROL METHODS °Your caregivers follow policies and procedures about the management of patient pain. These guidelines should be explained to you before surgery. Plans for pain control after surgery must be mutually decided upon and instituted with your full understanding and agreement. Do not be afraid to ask questions regarding the care you are receiving. There are many different ways your caregivers will attempt to control your pain, including the following methods. °As needed pain control °· You may be given pain medicine either through your intravenous (IV) tube, or as a pill or  liquid you can swallow. You will need to let your caregiver know when you are having pain. Then, your caregiver will give you the pain medicine ordered for you. °· Your pain medicine may make you constipated. If constipation occurs, drink more liquids if you can. Your caregiver may have you take a mild laxative. °IV patient-controlled analgesia pump (PCA pump) °· You can get your pain medicine through the IV tube which goes into your vein. You are able to control the amount of pain medicine that you get. The pain medicine flows in through an IV tube and is controlled by a pump. This pump gives you a set amount of pain medicine when you push the button hooked up to it. Nobody should push this button but you or someone specifically assigned by you to do so. It is set up to keep you from accidentally giving yourself too much pain medicine. You will be able to start using your pain pump in the recovery room after your surgery. This method can be helpful for most types of surgery. °· If you are still having too much pain, tell your caregiver. Also, tell your caregiver if you are feeling too sleepy or nauseous. °Continuous epidural pain control °· A thin, soft tube (catheter) is put into your back. Pain medicine flows through the catheter to lessen pain in the part of your body where the surgery is done. Continuous epidural pain control may work best for you if you are having surgery on your chest, abdomen, hip area, or legs. The epidural catheter is usually put into your back just before surgery. The catheter is left in until you can eat and take medicine by mouth. In most cases,   this may take 2 to 3 days. °· Giving pain medicine through the epidural catheter may help you heal faster because: °¨ Your bowel gets back to normal faster. °¨ You can get back to eating sooner. °¨ You can be up and walking sooner. °Medicine that numbs the area (local anesthetic) °· You may receive an injection of pain medicine near where the  pain is (local infiltration). °· You may receive an injection of pain medicine near the nerve that controls the sensation to a specific part of the body (peripheral nerve block). °· Medicine may be put in the spine to block pain (spinal block). °Opioids °· Moderate to moderately severe acute pain after surgery may respond to opioids. Opioids are narcotic pain medicine. Opioids are often combined with non-narcotic medicines to improve pain relief, diminish the risk of side effects, and reduce the chance of addiction. °· If you follow your caregiver's directions about taking opioids and you do not have a history of substance abuse, your risk of becoming addicted is exceptionally small. Opioids are given for short periods of time in careful doses to prevent addiction. °Other methods of pain control include: °· Steroids. °· Physical therapy. °· Heat and cold therapy. °· Compression, such as wrapping an elastic bandage around the area of pain. °· Massage. °These various ways of controlling pain may be used together. Combining different methods of pain control is called multimodal analgesia. Using this approach has many benefits, including being able to eat, move around, and leave the hospital sooner. °Document Released: 04/10/2002 Document Revised: 04/12/2011 Document Reviewed: 04/14/2010 °[employer]® Patient Information ©2015 [employer]. This information is not intended to replace advice given to you by your health care provider. Make sure you discuss any questions you have with your health care provider. ° °

## 2013-09-08 NOTE — ED Notes (Signed)
Patient transported to X-ray 

## 2013-09-08 NOTE — ED Provider Notes (Signed)
CSN: [id number]     Arrival date & time 09/08/13  1628 History   First MD Initiated Contact with Patient 09/08/13 1636     This chart was scribed for non-physician practitioner, Roxy Horseman PA-C working with Purvis Sheffield, MD by Arlan Organ, ED Scribe. This patient was seen in room TR11C/TR11C and the patient's care was started at 4:41 PM.   Chief Complaint  Patient presents with  . Leg Pain   HPI  HPI Comments: [name] [name] is a 48 y.o. female with a PMHx of anxiety, gastritis, thyroid disease, chronic pain, and GERD disease who presents to the Emergency Department complaining of constant, moderate, shooting R leg pain that has been ongoing since recent R ankle ligament repair 7/2. Casting replaced 8/3. Pt was given a prescription for Oxycodone 10 mg at that visit. However, currently she is out of her pain medication and is unable to use NSAIDS due to a history of gastritis. Pt was advised to go to [facility] for evaluation of pain. She denies any new injury, however, states she stepped back incorrectly on her foot as she is still trying to get a feel for her new cast. She mentions intermittent dysuria that has been ongoing since 7/2. Pt also reports a fever since surgery that has not been responsive to OTC Tylenol. Fever has been 102 at its highest. Surgeon has been made aware of recent fever. Pt with multiple known allergies listed below. No other concerns this visit.  Past Medical History  Diagnosis Date  . Anxiety   . Thyroid disease   . Substance abuse   . Pneumonia   . Bronchitis   . Kidney stone   . Endometriosis   . Chronic pain   . GERD (gastroesophageal reflux disease)   . Gastritis, chronic   . PONV (postoperative nausea and vomiting)   . Insomnia    Past Surgical History  Procedure Laterality Date  . Abdominal hysterectomy    . Cholecystectomy    . Appendectomy    . Ex laporotomy      x2-adhesion  . Knee arthroplasty      left knee age 76  . Finger arthrodesis       left ring  . Ankle reconstruction Right 08/24/2013    Procedure: RECONSTRUCTION ANKLE;  Surgeon: Harvie Junior, MD;  Location: [facility];  Service: Orthopedics;  Laterality: Right;   Family History  Problem Relation Age of Onset  . Thyroid disease Mother   . CAD Mother   . Thyroid disease Sister   . CAD Other    History  Substance Use Topics  . Smoking status: Current Every Day Smoker -- 0.50 packs/day  . Smokeless tobacco: Never Used  . Alcohol Use: No     Comment: socially-no now   OB History   Grav Para Term Preterm Abortions TAB SAB Ect Mult Living                 Review of Systems  Constitutional: Positive for fever.  Genitourinary: Positive for dysuria.  Musculoskeletal: Positive for arthralgias (R leg).      Allergies  Sulfa antibiotics; Compazine; Latex; Reglan; Zofran; Clindamycin/lincomycin; Morphine and related; and Penicillins  Home Medications   Prior to Admission medications   Medication Sig Start Date End Date Taking? Authorizing Provider  acetaminophen (TYLENOL) 500 MG tablet Take 500 mg by mouth every 6 (six) hours as needed for mild pain or moderate pain.     Historical Provider, MD  Buprenorphine HCl-Naloxone HCl (SUBOXONE) 8-2 MG FILM Place under the tongue 3 (three) times daily.    Historical Provider, MD  clonazePAM (KLONOPIN) 1 MG tablet Take 0.5-1 mg by mouth 2 (two) times daily. 0.5mg   as needed for anxiety    Historical Provider, MD  diphenhydrAMINE (BENADRYL) 50 MG capsule Take 50 mg by mouth every 6 (six) hours as needed for sleep.    Historical Provider, MD  omeprazole (PRILOSEC OTC) 20 MG tablet Take 20 mg by mouth daily.    Historical Provider, MD  Oxycodone HCl 10 MG TABS Take 1 tablet (10 mg total) by mouth every 6 (six) hours as needed (pain). 08/24/13   Matthew FolksJames G Bethune, PA-C  prazosin (MINIPRESS) 1 MG capsule Take 1 mg by mouth at bedtime. Total 3=3mg     Historical Provider, MD  promethazine (PHENERGAN) 25 MG suppository  Place 1 suppository (25 mg total) rectally every 8 (eight) hours as needed for nausea or vomiting. 05/17/13   Suzi RootsKevin E Steinl, MD  promethazine (PHENERGAN) 25 MG tablet Take 25 mg by mouth every 6 (six) hours as needed for nausea or vomiting.    Historical Provider, MD  sucralfate (CARAFATE) 1 G tablet Take 1 tablet (1 g total) by mouth 4 (four) times daily -  with meals and at bedtime. 03/24/13   Elwin MochaBlair Walden, MD   Triage Vitals: BP 111/85  Pulse 113  Temp(Src) 98.4 F (36.9 C)  Resp 18  Ht 5\' 4"  (1.626 m)  Wt 142 lb (64.411 kg)  BMI 24.36 kg/m2  SpO2 99%   Physical Exam  Nursing note and vitals reviewed. Constitutional: She is oriented to person, place, and time. She appears well-developed and well-nourished.  HENT:  Head: Normocephalic and atraumatic.  Eyes: Conjunctivae and EOM are normal. Pupils are equal, round, and reactive to light.  Neck: Normal range of motion. Neck supple.  Cardiovascular: Normal rate and regular rhythm.  Exam reveals no gallop and no friction rub.   No murmur heard. Brisk capillary refill  Pulmonary/Chest: Effort normal and breath sounds normal. No respiratory distress. She has no wheezes. She has no rales. She exhibits no tenderness.  Abdominal: Soft. Bowel sounds are normal. She exhibits no distension and no mass. There is no tenderness. There is no rebound and no guarding.  Musculoskeletal: Normal range of motion. She exhibits no edema and no tenderness.  Right leg in cast, no evidence of swelling or cellulitis  Neurological: She is alert and oriented to person, place, and time.  Good sensation in toes  Skin: Skin is warm and dry.  Psychiatric: She has a normal mood and affect. Her behavior is normal. Judgment and thought content normal.    ED Course  Procedures (including critical care time)  DIAGNOSTIC STUDIES: Oxygen Saturation is 99% on RA, Normal by my interpretation.    COORDINATION OF CARE: 4:41 PM- Will order DG ankle complete R and  urinalysis. Will give Dilaudid in ED. Will send home with Oxycontin 12 hour extended release for bedtime along with a short course of Oxycodone to help manage symptoms. Discussed treatment plan with pt at bedside and pt agreed to plan.     Labs Review Labs Reviewed - No data to display  Imaging Review No results found.   EKG Interpretation None      MDM   Final diagnoses:  Post-op pain    Patient seen by and discussed with Dr. Romeo AppleHarrison.  Recommends giving the patient oxycontin ER for a couple of days and  some additional percocet.  Patient advised that this will be a one time occurrence.  For any additional break through pain she will need to follow-up with her orthopedic surgeon.  She has follow-up on Monday morning.  No evidence of cellulitis.  Plain films are negative.  UA is neg for UTI.  Dr. Romeo Apple agrees with discharge and treatment plan.  I personally performed the services described in this documentation, which was scribed in my presence. The recorded information has been reviewed and is accurate.    Roxy Horseman, PA-C 09/08/13 1904

## 2013-09-09 NOTE — ED Provider Notes (Signed)
Medical screening examination/treatment/procedure(s) were conducted as a shared visit with non-physician practitioner(s) and myself.  I personally evaluated the patient during the encounter.   EKG Interpretation None      I interviewed and examined the patient. Lungs are CTAB. Cardiac exam wnl. Abdomen soft.  Normal appearance of RLE w/ normal sensation and 2+ distal pulses. Plain film non-contrib. Will provide pain control regimen and rec f/u w/ her ortho.   Purvis SheffieldForrest Uchechukwu Dhawan, MD 09/09/13 1233

## 2013-09-13 ENCOUNTER — Emergency Department (HOSPITAL_COMMUNITY)
Admission: EM | Admit: 2013-09-13 | Discharge: 2013-09-13 | Disposition: A | Discharge status: Home / Self Care | Payer: 59 | Attending: Emergency Medicine | Admitting: Emergency Medicine

## 2013-09-13 ENCOUNTER — Emergency Department (HOSPITAL_COMMUNITY): Payer: 59

## 2013-09-13 ENCOUNTER — Encounter (HOSPITAL_COMMUNITY): Payer: Self-pay | Admitting: Emergency Medicine

## 2013-09-13 DIAGNOSIS — F17200 Nicotine dependence, unspecified, uncomplicated: Secondary | ICD-10-CM | POA: Diagnosis not present

## 2013-09-13 DIAGNOSIS — S5002XA Contusion of left elbow, initial encounter: Secondary | ICD-10-CM

## 2013-09-13 DIAGNOSIS — K2940 Chronic atrophic gastritis without bleeding: Secondary | ICD-10-CM | POA: Insufficient documentation

## 2013-09-13 DIAGNOSIS — G8929 Other chronic pain: Secondary | ICD-10-CM | POA: Insufficient documentation

## 2013-09-13 DIAGNOSIS — G4700 Insomnia, unspecified: Secondary | ICD-10-CM | POA: Insufficient documentation

## 2013-09-13 DIAGNOSIS — Z8701 Personal history of pneumonia (recurrent): Secondary | ICD-10-CM | POA: Insufficient documentation

## 2013-09-13 DIAGNOSIS — Z8742 Personal history of other diseases of the female genital tract: Secondary | ICD-10-CM | POA: Diagnosis not present

## 2013-09-13 DIAGNOSIS — Z8709 Personal history of other diseases of the respiratory system: Secondary | ICD-10-CM | POA: Insufficient documentation

## 2013-09-13 DIAGNOSIS — Z880 Allergy status to penicillin: Secondary | ICD-10-CM | POA: Insufficient documentation

## 2013-09-13 DIAGNOSIS — Z8639 Personal history of other endocrine, nutritional and metabolic disease: Secondary | ICD-10-CM | POA: Insufficient documentation

## 2013-09-13 DIAGNOSIS — F411 Generalized anxiety disorder: Secondary | ICD-10-CM | POA: Diagnosis not present

## 2013-09-13 DIAGNOSIS — Z862 Personal history of diseases of the blood and blood-forming organs and certain disorders involving the immune mechanism: Secondary | ICD-10-CM | POA: Insufficient documentation

## 2013-09-13 DIAGNOSIS — Z87442 Personal history of urinary calculi: Secondary | ICD-10-CM | POA: Diagnosis not present

## 2013-09-13 DIAGNOSIS — Y92009 Unspecified place in unspecified non-institutional (private) residence as the place of occurrence of the external cause: Secondary | ICD-10-CM | POA: Diagnosis not present

## 2013-09-13 DIAGNOSIS — S4980XA Other specified injuries of shoulder and upper arm, unspecified arm, initial encounter: Secondary | ICD-10-CM | POA: Diagnosis present

## 2013-09-13 DIAGNOSIS — W1811XA Fall from or off toilet without subsequent striking against object, initial encounter: Secondary | ICD-10-CM | POA: Diagnosis not present

## 2013-09-13 DIAGNOSIS — S5000XA Contusion of unspecified elbow, initial encounter: Secondary | ICD-10-CM | POA: Diagnosis not present

## 2013-09-13 DIAGNOSIS — Z79899 Other long term (current) drug therapy: Secondary | ICD-10-CM | POA: Insufficient documentation

## 2013-09-13 DIAGNOSIS — K219 Gastro-esophageal reflux disease without esophagitis: Secondary | ICD-10-CM | POA: Diagnosis not present

## 2013-09-13 DIAGNOSIS — S40019A Contusion of unspecified shoulder, initial encounter: Secondary | ICD-10-CM | POA: Diagnosis not present

## 2013-09-13 DIAGNOSIS — Y9389 Activity, other specified: Secondary | ICD-10-CM | POA: Diagnosis not present

## 2013-09-13 DIAGNOSIS — Z91040 Latex allergy status: Secondary | ICD-10-CM | POA: Diagnosis not present

## 2013-09-13 DIAGNOSIS — S46909A Unspecified injury of unspecified muscle, fascia and tendon at shoulder and upper arm level, unspecified arm, initial encounter: Secondary | ICD-10-CM | POA: Diagnosis present

## 2013-09-13 DIAGNOSIS — S40011A Contusion of right shoulder, initial encounter: Secondary | ICD-10-CM

## 2013-09-13 MED ORDER — HYDROMORPHONE HCL PF 1 MG/ML IJ SOLN
1.0000 mg | Freq: Once | INTRAMUSCULAR | Status: AC
  Administered 2013-09-13: 1 mg via INTRAVENOUS
  Filled 2013-09-13: qty 1

## 2013-09-13 MED ORDER — KETOROLAC TROMETHAMINE 30 MG/ML IJ SOLN
30.0000 mg | Freq: Once | INTRAMUSCULAR | Status: AC
  Administered 2013-09-13: 30 mg via INTRAVENOUS
  Filled 2013-09-13: qty 1

## 2013-09-13 MED ORDER — OXYCODONE-ACETAMINOPHEN 5-325 MG PO TABS
1.0000 | ORAL_TABLET | Freq: Four times a day (QID) | ORAL | Status: DC | PRN
Start: 2013-09-13 — End: 2013-09-23

## 2013-09-13 MED ORDER — IBUPROFEN 800 MG PO TABS: 800.0000 mg | ORAL_TABLET | Freq: Three times a day (TID) | ORAL | Status: DC | PRN

## 2013-09-13 NOTE — Discharge Instructions (Signed)
Return here as needed. Follow up with your orthopedist. °

## 2013-09-13 NOTE — ED Provider Notes (Signed)
CSN: [id number]     Arrival date & time 09/13/13  0541 History   First MD Initiated Contact with Patient 09/13/13 [phone]     Chief Complaint  Patient presents with  . Arm Injury     (Consider location/radiation/quality/duration/timing/severity/associated sxs/prior Treatment) HPI Patient presents to the emergency department following a fall that occurred just prior to arrival.  The patient, states, that she was going to the bathroom, and she fell, landing against her right arm.  Her right arm.  The toilet she's having pain in her right elbow, and right shoulder.  The pain radiates from her right elbow down to her mid forearm and from her shoulder down to her elbow.  The patient, states, that she did not have head injury, neck pain, back pain, loss of consciousness, weakness, dizziness, blurred vision, nausea, vomiting, or syncope.  The patient, states, that she did not take any medications prior to arrival.  Patient, states, that movement and palpation make the pain, worse Past Medical History  Diagnosis Date  . Anxiety   . Thyroid disease   . Substance abuse   . Pneumonia   . Bronchitis   . Kidney stone   . Endometriosis   . Chronic pain   . GERD (gastroesophageal reflux disease)   . Gastritis, chronic   . PONV (postoperative nausea and vomiting)   . Insomnia    Past Surgical History  Procedure Laterality Date  . Abdominal hysterectomy    . Cholecystectomy    . Appendectomy    . Ex laporotomy      x2-adhesion  . Knee arthroplasty      left knee age 89  . Finger arthrodesis      left ring  . Ankle reconstruction Right 08/24/2013    Procedure: RECONSTRUCTION ANKLE;  Surgeon: Harvie JuniorJohn L Graves, MD;  Location: [facility];  Service: Orthopedics;  Laterality: Right;   Family History  Problem Relation Age of Onset  . Thyroid disease Mother   . CAD Mother   . Thyroid disease Sister   . CAD Other    History  Substance Use Topics  . Smoking status: Current Every Day  Smoker -- 0.50 packs/day  . Smokeless tobacco: Never Used  . Alcohol Use: No     Comment: socially-no now   OB History   Grav Para Term Preterm Abortions TAB SAB Ect Mult Living                 Review of Systems All other systems negative except as documented in the HPI. All pertinent positives and negatives as reviewed in the HPI.    Allergies  Sulfa antibiotics; Compazine; Latex; Reglan; Zofran; Clindamycin/lincomycin; Morphine and related; and Penicillins  Home Medications   Prior to Admission medications   Medication Sig Start Date End Date Taking? Authorizing Provider  acetaminophen (TYLENOL) 500 MG tablet Take 500 mg by mouth every 6 (six) hours as needed for mild pain or moderate pain.    Yes Historical Provider, MD  Buprenorphine HCl-Naloxone HCl (SUBOXONE) 8-2 MG FILM Place under the tongue 3 (three) times daily.   Yes Historical Provider, MD  clonazePAM (KLONOPIN) 1 MG tablet Take 0.5-1 mg by mouth 2 (two) times daily. 0.5mg   as needed for anxiety   Yes Historical Provider, MD  diphenhydrAMINE (BENADRYL) 50 MG capsule Take 50 mg by mouth every 6 (six) hours as needed for sleep.   Yes Historical Provider, MD  omeprazole (PRILOSEC OTC) 20 MG tablet Take 20 mg  by mouth daily.   Yes Historical Provider, MD  OxyCODONE (OXYCONTIN) 10 mg T12A 12 hr tablet Take 1 tablet (10 mg total) by mouth every 12 (twelve) hours. 09/08/13  Yes Roxy Horseman, PA-C  oxyCODONE-acetaminophen (PERCOCET/ROXICET) 5-325 MG per tablet Take 2 tablets by mouth every 6 (six) hours as needed for severe pain. 09/08/13  Yes Roxy Horseman, PA-C  prazosin (MINIPRESS) 1 MG capsule Take 1 mg by mouth at bedtime. Total 3=3mg    Yes Historical Provider, MD  promethazine (PHENERGAN) 25 MG tablet Take 25 mg by mouth every 6 (six) hours as needed for nausea or vomiting.   Yes Historical Provider, MD  sucralfate (CARAFATE) 1 G tablet Take 1 g by mouth 4 (four) times daily.   Yes Historical Provider, MD  traZODone  (DESYREL) 50 MG tablet Take 50 mg by mouth at bedtime as needed for sleep.   Yes Historical Provider, MD   BP 98/60  Pulse 75  Temp(Src) 97.5 F (36.4 C) (Oral)  Resp 15  SpO2 100% Physical Exam  Nursing note and vitals reviewed. Constitutional: She is oriented to person, place, and time. She appears well-developed and well-nourished. No distress.  HENT:  Head: Normocephalic and atraumatic.  Mouth/Throat: Oropharynx is clear and moist.  Eyes: Pupils are equal, round, and reactive to light.  Neck: Normal range of motion. Neck supple.  Cardiovascular: Normal rate, regular rhythm and intact distal pulses.  Exam reveals no gallop and no friction rub.   No murmur heard. Pulmonary/Chest: Effort normal and breath sounds normal. No respiratory distress.  Musculoskeletal:       Right shoulder: She exhibits decreased range of motion, tenderness and pain. She exhibits no bony tenderness, no swelling, no effusion, no crepitus, no deformity, no laceration, no spasm, normal pulse and normal strength.  Neurological: She is alert and oriented to person, place, and time.  Skin: Skin is warm and dry. No erythema.    ED Course  Procedures (including critical care time) Labs Review Labs Reviewed - No data to display  Imaging Review Dg Shoulder Right  09/13/2013   CLINICAL DATA:  Status post fall.  Right shoulder pain.  EXAM: RIGHT SHOULDER - 2+ VIEW  COMPARISON:  None.  FINDINGS: The humerus is located and the acromioclavicular joint is intact. There is no fracture. No notable degenerative change is present about the shoulder. Image right lung and ribs are unremarkable.  IMPRESSION: Negative exam.   Electronically Signed   By: Drusilla Kanner M.D.   On: 09/13/2013 07:17   Dg Elbow Complete Right  09/13/2013   CLINICAL DATA:  Status post fall; right elbow pain.  EXAM: RIGHT ELBOW - COMPLETE 3+ VIEW  COMPARISON:  None.  FINDINGS: There is no evidence of fracture or dislocation. The visualized joint  spaces are preserved. No significant joint effusion is identified. The soft tissues are unremarkable in appearance.  IMPRESSION: No evidence of fracture or dislocation.   Electronically Signed   By: Roanna Raider M.D.   On: 09/13/2013 07:17     The patient will be referred to her orthopedist. Told to use ice and heat on her shoulder.   Carlyle Dolly, PA-C 09/13/13 [phone]

## 2013-09-13 NOTE — ED Notes (Addendum)
Pt from home, reports she fell this morning and fell on the toilet seat and thinks she may have broken her right arm. Arm appears red and swollen,  +radial pulse and <3 cap refill and able to wiggle fingers. Recently had ankle reconstructive surgery on her right ankle on July 24th, right leg/ankle in cast.

## 2013-09-13 NOTE — ED Notes (Signed)
Pt requesting more pain medication. PA Ebbie Ridgehris Lawyer made aware.

## 2013-09-14 NOTE — ED Provider Notes (Signed)
Medical screening examination/treatment/procedure(s) were performed by non-physician practitioner and as supervising physician I was immediately available for consultation/collaboration.  Audree CamelScott T Daijanae Rafalski, MD 09/14/13 [phone]

## 2013-09-23 ENCOUNTER — Emergency Department (HOSPITAL_COMMUNITY)
Admission: EM | Admit: 2013-09-23 | Discharge: 2013-09-23 | Disposition: A | Discharge status: Home / Self Care | Payer: 59 | Attending: Emergency Medicine | Admitting: Emergency Medicine

## 2013-09-23 ENCOUNTER — Emergency Department (HOSPITAL_COMMUNITY): Payer: 59

## 2013-09-23 ENCOUNTER — Encounter (HOSPITAL_COMMUNITY): Payer: Self-pay | Admitting: Emergency Medicine

## 2013-09-23 DIAGNOSIS — R509 Fever, unspecified: Secondary | ICD-10-CM | POA: Diagnosis not present

## 2013-09-23 DIAGNOSIS — Z8701 Personal history of pneumonia (recurrent): Secondary | ICD-10-CM | POA: Insufficient documentation

## 2013-09-23 DIAGNOSIS — M25571 Pain in right ankle and joints of right foot: Secondary | ICD-10-CM

## 2013-09-23 DIAGNOSIS — G8918 Other acute postprocedural pain: Secondary | ICD-10-CM | POA: Diagnosis not present

## 2013-09-23 DIAGNOSIS — K219 Gastro-esophageal reflux disease without esophagitis: Secondary | ICD-10-CM | POA: Insufficient documentation

## 2013-09-23 DIAGNOSIS — F411 Generalized anxiety disorder: Secondary | ICD-10-CM | POA: Diagnosis not present

## 2013-09-23 DIAGNOSIS — Z8639 Personal history of other endocrine, nutritional and metabolic disease: Secondary | ICD-10-CM | POA: Insufficient documentation

## 2013-09-23 DIAGNOSIS — Z79899 Other long term (current) drug therapy: Secondary | ICD-10-CM | POA: Diagnosis not present

## 2013-09-23 DIAGNOSIS — Z91040 Latex allergy status: Secondary | ICD-10-CM | POA: Insufficient documentation

## 2013-09-23 DIAGNOSIS — M79609 Pain in unspecified limb: Secondary | ICD-10-CM | POA: Insufficient documentation

## 2013-09-23 DIAGNOSIS — Z862 Personal history of diseases of the blood and blood-forming organs and certain disorders involving the immune mechanism: Secondary | ICD-10-CM | POA: Insufficient documentation

## 2013-09-23 DIAGNOSIS — F17200 Nicotine dependence, unspecified, uncomplicated: Secondary | ICD-10-CM | POA: Diagnosis not present

## 2013-09-23 DIAGNOSIS — M25579 Pain in unspecified ankle and joints of unspecified foot: Secondary | ICD-10-CM | POA: Insufficient documentation

## 2013-09-23 DIAGNOSIS — Z8709 Personal history of other diseases of the respiratory system: Secondary | ICD-10-CM | POA: Diagnosis not present

## 2013-09-23 DIAGNOSIS — Z87442 Personal history of urinary calculi: Secondary | ICD-10-CM | POA: Insufficient documentation

## 2013-09-23 DIAGNOSIS — Z880 Allergy status to penicillin: Secondary | ICD-10-CM | POA: Diagnosis not present

## 2013-09-23 DIAGNOSIS — G8929 Other chronic pain: Secondary | ICD-10-CM | POA: Diagnosis not present

## 2013-09-23 DIAGNOSIS — Z8742 Personal history of other diseases of the female genital tract: Secondary | ICD-10-CM | POA: Diagnosis not present

## 2013-09-23 DIAGNOSIS — Z9889 Other specified postprocedural states: Secondary | ICD-10-CM | POA: Diagnosis not present

## 2013-09-23 MED ORDER — OXYCODONE-ACETAMINOPHEN 5-325 MG PO TABS
2.0000 | ORAL_TABLET | Freq: Once | ORAL | Status: AC
  Administered 2013-09-23: 2 via ORAL
  Filled 2013-09-23: qty 2

## 2013-09-23 MED ORDER — HYDROMORPHONE HCL PF 1 MG/ML IJ SOLN
1.0000 mg | Freq: Once | INTRAMUSCULAR | Status: AC
  Administered 2013-09-23: 1 mg via INTRAMUSCULAR
  Filled 2013-09-23: qty 1

## 2013-09-23 MED ORDER — PROMETHAZINE HCL 25 MG/ML IJ SOLN
25.0000 mg | Freq: Once | INTRAMUSCULAR | Status: AC
  Administered 2013-09-23: 25 mg via INTRAMUSCULAR
  Filled 2013-09-23: qty 1

## 2013-09-23 NOTE — ED Notes (Signed)
Patient has a cast to her right foot. She states she had a cast post surgery and then the cast was removed and a boot was put in place. Patient states she had the boot for one day and had to have the cast reapplied due to the pain and the boot rubbing on her incision. No swelling noted, good sensation, toes are cool to touch but are equal to her left foot, good cap refill.

## 2013-09-23 NOTE — ED Notes (Signed)
Ortho tech at the bedside to remove cast, EDP at the bedside to evaluate incision.

## 2013-09-23 NOTE — ED Provider Notes (Signed)
CSN: [id number]     Arrival date & time 09/23/13  4098 History   First MD Initiated Contact with Patient 09/23/13 [phone]     Chief Complaint  Patient presents with  . Leg Pain     (Consider location/radiation/quality/duration/timing/severity/associated sxs/prior Treatment) HPI Comments: 48 year old female with history of pneumonia, cholecystectomy, substance abuse, chronic pain presents with right ankle pain since surgery July 24 by Dr. Althea Grimmer orthopedics. Patient was seen last Thursday and had cast changed pain. Patient is unable to tolerate boot. Patient's had subjective fevers intermittent since surgery. Patient has seen orthopedics did not feel it was significant for infection and more pain control. Patient's been taking Norco tens every 4-6 hours and only mild relief. Patient says she has a low pain threshold and needs higher dose pain medicines. Pain with any range of motion of the ankle. No vomiting or chills.  Patient is a 48 y.o. female presenting with leg pain. The history is provided by the patient.  Leg Pain Associated symptoms: fever   Associated symptoms: no back pain and no neck pain     Past Medical History  Diagnosis Date  . Anxiety   . Thyroid disease   . Substance abuse   . Pneumonia   . Bronchitis   . Kidney stone   . Endometriosis   . Chronic pain   . GERD (gastroesophageal reflux disease)   . Gastritis, chronic   . PONV (postoperative nausea and vomiting)   . Insomnia    Past Surgical History  Procedure Laterality Date  . Abdominal hysterectomy    . Cholecystectomy    . Appendectomy    . Ex laporotomy      x2-adhesion  . Knee arthroplasty      left knee age 32  . Finger arthrodesis      left ring  . Ankle reconstruction Right 08/24/2013    Procedure: RECONSTRUCTION ANKLE;  Surgeon: Harvie Junior, MD;  Location: [facility];  Service: Orthopedics;  Laterality: Right;   Family History  Problem Relation Age of Onset  . Thyroid  disease Mother   . CAD Mother   . Thyroid disease Sister   . CAD Other    History  Substance Use Topics  . Smoking status: Current Every Day Smoker -- 0.50 packs/day  . Smokeless tobacco: Never Used  . Alcohol Use: No     Comment: socially-no now   OB History   Grav Para Term Preterm Abortions TAB SAB Ect Mult Living                 Review of Systems  Constitutional: Positive for fever. Negative for chills.  HENT: Negative for congestion.   Eyes: Negative for visual disturbance.  Respiratory: Negative for shortness of breath.   Cardiovascular: Negative for chest pain.  Gastrointestinal: Negative for vomiting and abdominal pain.  Genitourinary: Negative for dysuria and flank pain.  Musculoskeletal: Negative for back pain, neck pain and neck stiffness.  Skin: Negative for rash.  Neurological: Negative for light-headedness and headaches.      Allergies  Sulfa antibiotics; Compazine; Latex; Reglan; Zofran; Clindamycin/lincomycin; Morphine and related; and Penicillins  Home Medications   Prior to Admission medications   Medication Sig Start Date End Date Taking? Authorizing Provider  acetaminophen (TYLENOL) 500 MG tablet Take 500 mg by mouth every 6 (six) hours as needed for mild pain or moderate pain.     Historical Provider, MD  Buprenorphine HCl-Naloxone HCl (SUBOXONE) 8-2 MG FILM Place  under the tongue 3 (three) times daily.    Historical Provider, MD  clonazePAM (KLONOPIN) 1 MG tablet Take 0.5-1 mg by mouth 2 (two) times daily. 0.5mg   as needed for anxiety    Historical Provider, MD  diphenhydrAMINE (BENADRYL) 50 MG capsule Take 50 mg by mouth every 6 (six) hours as needed for sleep.    Historical Provider, MD  ibuprofen (ADVIL,MOTRIN) 800 MG tablet Take 1 tablet (800 mg total) by mouth every 8 (eight) hours as needed. 09/13/13   Jamesetta Orleans Lawyer, PA-C  omeprazole (PRILOSEC OTC) 20 MG tablet Take 20 mg by mouth daily.    Historical Provider, MD  OxyCODONE (OXYCONTIN)  10 mg T12A 12 hr tablet Take 1 tablet (10 mg total) by mouth every 12 (twelve) hours. 09/08/13   Roxy Horseman, PA-C  oxyCODONE-acetaminophen (PERCOCET/ROXICET) 5-325 MG per tablet Take 2 tablets by mouth every 6 (six) hours as needed for severe pain. 09/08/13   Roxy Horseman, PA-C  oxyCODONE-acetaminophen (PERCOCET/ROXICET) 5-325 MG per tablet Take 1 tablet by mouth every 6 (six) hours as needed for severe pain. 09/13/13   Jamesetta Orleans Lawyer, PA-C  prazosin (MINIPRESS) 1 MG capsule Take 1 mg by mouth at bedtime. Total 3=3mg     Historical Provider, MD  promethazine (PHENERGAN) 25 MG tablet Take 25 mg by mouth every 6 (six) hours as needed for nausea or vomiting.    Historical Provider, MD  sucralfate (CARAFATE) 1 G tablet Take 1 g by mouth 4 (four) times daily.    Historical Provider, MD  traZODone (DESYREL) 50 MG tablet Take 50 mg by mouth at bedtime as needed for sleep.    Historical Provider, MD   BP 108/67  Pulse 70  Temp(Src) 97.7 F (36.5 C) (Oral)  Resp 17  Ht  (1.651 m)  Wt 145 lb (65.772 kg)  BMI 24.13 kg/m2  SpO2 97% Physical Exam  Nursing note and vitals reviewed. Constitutional: She is oriented to person, place, and time. She appears well-developed and well-nourished.  HENT:  Head: Normocephalic and atraumatic.  Eyes: Conjunctivae are normal. Right eye exhibits no discharge. Left eye exhibits no discharge.  Neck: Normal range of motion. Neck supple. No tracheal deviation present.  Cardiovascular: Normal rate and regular rhythm.   Pulmonary/Chest: Effort normal and breath sounds normal.  Abdominal: Soft. She exhibits no distension. There is no tenderness. There is no guarding.  Musculoskeletal: She exhibits no edema.  Neurological: She is alert and oriented to person, place, and time.  Skin: Skin is warm. No rash noted.  Right short leg ankle cast in place, right leg supple no swelling, good cap refill, plan for evaluation of skin once cast removed.  Psychiatric: She  has a normal mood and affect.    ED Course  Procedures (including critical care time) Labs Review Labs Reviewed - No data to display  Imaging Review No results found. Dg Shoulder Right  09/13/2013   CLINICAL DATA:  Status post fall.  Right shoulder pain.  EXAM: RIGHT SHOULDER - 2+ VIEW  COMPARISON:  None.  FINDINGS: The humerus is located and the acromioclavicular joint is intact. There is no fracture. No notable degenerative change is present about the shoulder. Image right lung and ribs are unremarkable.  IMPRESSION: Negative exam.   Electronically Signed   By: Drusilla Kanner M.D.   On: 09/13/2013 07:17   Dg Elbow Complete Right  09/13/2013   CLINICAL DATA:  Status post fall; right elbow pain.  EXAM: RIGHT ELBOW - COMPLETE  3+ VIEW  COMPARISON:  None.  FINDINGS: There is no evidence of fracture or dislocation. The visualized joint spaces are preserved. No significant joint effusion is identified. The soft tissues are unremarkable in appearance.  IMPRESSION: No evidence of fracture or dislocation.   Electronically Signed   By: Roanna Raider M.D.   On: 09/13/2013 07:17   Dg Ankle Complete Right  09/23/2013   CLINICAL DATA:  Right ankle pain.  EXAM: RIGHT ANKLE - COMPLETE 3+ VIEW  COMPARISON:  September 08, 2013.  FINDINGS: There is no evidence of fracture, dislocation, or joint effusion. There is no evidence of arthropathy or other focal bone abnormality. Soft tissues are unremarkable. Cast has been removed.  IMPRESSION: Interval removal of cast. No acute abnormality seen in the right ankle.   Electronically Signed   By: Roque Lias M.D.   On: 09/23/2013 09:16   Dg Ankle Complete Right  09/08/2013   CLINICAL DATA:  Recent ankle surgery.  Pain.  EXAM: RIGHT ANKLE - COMPLETE 3+ VIEW  COMPARISON:  None.  FINDINGS: There is no evidence of fracture, dislocation, or joint effusion. There is no evidence of arthropathy or other focal bone abnormality. Soft tissues are unremarkable.  IMPRESSION: Negative.    Electronically Signed   By: Davonna Belling M.D.   On: 09/08/2013 18:52    EKG Interpretation None      MDM   Final diagnoses:  Right ankle pain  Postoperative pain   Well-appearing female with pain since surgery one month prior, patient has close followup outpatient with the surgeon and has seen a surgeon since. Patient feels she's had subjective fevers worsening pain. Patient's well-appearing and plan for cast removal for evaluation of of the ankle to ensure no sign of infection. Patient is signed out to Dr. Unknown Foley with plan to evaluate skin once cast removed and likely plan for outpatient followup with orthopedics. Pain meds given in the ER. Urinalysis to look for cause of subjective fever. Very low pretest probability for significant infection or bone infection since patient has had this pain since surgery and has seen a specialist.  Filed Vitals:   09/23/13 0545 09/23/13 0649 09/23/13 0700 09/23/13 0713  BP: 128/73 115/69 108/67 108/67  Pulse: 84 80 73 70  Temp:      TempSrc:      Resp:    17  Height:      Weight:      SpO2: 96% 97% 94% 97%      Enid Skeens, MD 09/29/13 [phone]

## 2013-09-23 NOTE — ED Notes (Signed)
Ortho notified of cast removal and EDP to inspect foot prior to reapplication of new cast.

## 2013-09-23 NOTE — ED Notes (Signed)
Ortho paged to recast pts ankle.

## 2013-09-23 NOTE — ED Provider Notes (Signed)
8:45 AM Care assumed from Dr. Jodi Mourning.  Cast removed.  Ankle incision is c/d/i.  Foot is warm, well perfused, 2+ DP pulses.  No edema.  Pt states she has accidentally put weight on her foot a few times, maybe this is reason for increased pain.  Plain film pending.    10:18 AM Xray looks good.  Recasted.  Plan dc home.  Advised ortho f/u.  Clinical Impression: 1. Right ankle pain   2. Postoperative pain       Candyce Churn III, MD 09/23/13 1018

## 2013-09-23 NOTE — ED Notes (Signed)
Ortho tech at the bedside to recast foot.

## 2013-09-23 NOTE — Progress Notes (Signed)
Orthopedic Tech Progress Note Patient Details:  [name] [name] 20-Dec-1965 [id number]  Casting Type of Cast: Short leg cast Cast Intervention: Removal;Application     Cammer, Mickie Bail 09/23/2013, 1:07 PM

## 2013-09-23 NOTE — Discharge Instructions (Signed)
If you were given medicines take as directed.  If you are on coumadin or contraceptives realize their levels and effectiveness is altered by many different medicines.  If you have any reaction (rash, tongues swelling, other) to the medicines stop taking and see a physician.   Please follow up as directed and return to the ER or see a physician for new or worsening symptoms.  Thank you. Filed Vitals:   09/23/13 0545 09/23/13 0649 09/23/13 0700 09/23/13 0713  BP: 128/73 115/69 108/67 108/67  Pulse: 84 80 73 70  Temp:      TempSrc:      Resp:    17  Height:      Weight:      SpO2: 96% 97% 94% 97%

## 2013-09-23 NOTE — ED Notes (Signed)
Patient presents stating that she has had right ankle surgery July 24th.  She has been having trouble getting her pain under control.  Toes cool to touch.  States she has been running a fever and the cast is hurting her ankle

## 2013-11-14 ENCOUNTER — Ambulatory Visit (INDEPENDENT_AMBULATORY_CARE_PROVIDER_SITE_OTHER): Payer: 59 | Admitting: Family Medicine

## 2013-11-14 ENCOUNTER — Encounter: Payer: Self-pay | Admitting: Family Medicine

## 2013-11-14 ENCOUNTER — Other Ambulatory Visit (INDEPENDENT_AMBULATORY_CARE_PROVIDER_SITE_OTHER): Payer: 59

## 2013-11-14 ENCOUNTER — Ambulatory Visit (INDEPENDENT_AMBULATORY_CARE_PROVIDER_SITE_OTHER)
Admission: RE | Admit: 2013-11-14 | Discharge: 2013-11-14 | Disposition: A | Discharge status: Home / Self Care | Payer: 59 | Source: Ambulatory Visit | Attending: Family Medicine | Admitting: Family Medicine

## 2013-11-14 VITALS — BP 110/78 | HR 93 | Ht 64.0 in

## 2013-11-14 DIAGNOSIS — M25571 Pain in right ankle and joints of right foot: Secondary | ICD-10-CM

## 2013-11-14 DIAGNOSIS — M79671 Pain in right foot: Secondary | ICD-10-CM

## 2013-11-14 MED ORDER — DULOXETINE HCL 20 MG PO CPEP: 20.0000 mg | ORAL_CAPSULE | Freq: Every day | ORAL | Status: DC

## 2013-11-14 NOTE — Progress Notes (Signed)
[name] ScaleZach Lenardo [name] D.O. [facility] [address] [place], [place] [address] Phone: [phone](336) [phone] Subjective:      CC: Right ankle pain, second opinion  BJY:NWGNFAOZHYHPI:Subjective [name] RotaKaren [name] is a 48 y.o. female coming in with complaint of right ankle pain. Patient does have a past medical history significant for her opiate dependence Patient has had right ankle pain for quite some time and unfortunately had a tear of the anterior tibiofibular ligament and had continued instability of the ankle. Patient was not improving with conservative therapy including formal physical therapy in 2 corticosteroid injections. Patient actually did have ankle reconstruction by Dr. Luiz BlareGraves in July. Patient continued to have a significant amount of pain even a month after the surgery. Patient was put in a cast. Patient did wear the cast for quite some time but no significant improvement. On x-rays back in September 23, 2013 patient did have x-rays. X-rays showed no bony abnormality. Patient was put in the past though for 2 weeks but did not have any significant improvement. Patient is here wanting another provider to help in concert working with her orthopedic physician on what is going on. Patient is scheduled to start physical therapy again in one week Patient states unfortunately she is also having different times where her foot to be extremely hot and and did extremely colds or within minutes. Patient states that this can't cause significant discoloration as well where he gets red or even purple and then it seems to get very white. Patient denies any significant numbness sometimes feels like there is a tingling sensation.    x-rays were ordered reviewed and interpreted by me today. X-rays the patient's right ankle and right foot show show some mild osteopenia and is likely secondary to under use but no fracture noted  Past medical history, social, surgical and family history all reviewed in electronic medical record.   Review  of Systems: No headache, visual changes, nausea, vomiting, diarrhea, constipation, dizziness, abdominal pain, skin rash, fevers, chills, night sweats, weight loss, swollen lymph nodes, body aches, joint swelling, muscle aches, chest pain, shortness of breath, mood changes.   Objective Blood pressure 110/78, pulse 93, height 5\' 4"  (1.626 m), SpO2 98.00%.  General: No apparent distress alert and oriented x3 mood and affect normal, dressed appropriately.  HEENT: Pupils equal, extraocular movements intact  Respiratory: Patient's speak in full sentences and does not appear short of breath  Cardiovascular: No lower extremity edema, non tender, no erythema  Skin: Warm dry intact with no signs of infection or rash on extremities or on axial skeleton.  Abdomen: Soft nontender  Neuro: Cranial nerves II through XII are intact, neurovascularly intact in all extremities with 2+ DTRs and 2+ pulses.  Lymph: No lymphadenopathy of posterior or anterior cervical chain or axillae bilaterally.  Gait normal with good balance and coordination.  MSK:  Non tender with full range of motion and good stability and symmetric strength and tone of shoulders, elbows, wrist, hip, knee and bilaterally.  Ankle: Right Patient does have +1 effusion of the ankle and foot Patient does have limited range of motion actively and passively moderately improved still lacking the last 10 of dorsiflexion in the last 15 of plantarflexion. Patient can evert the foot fine but has pain with eversion. Patient is tender to palpation in multiple areas including patellar dome, navicular prominence, as well as over the insertion of the ATFL. Patient is able to bear weight on her toes but will not put her ankle down. Contralateral  ankle unremarkable  MSK US performed of: Right ankle This study was ordered, performed, and interpreted by Terrilee FilesZach Fynley Chrystal D.O.  Foot/Ankle:   All structures visualized.   Talar dome unremarkable because of increasing  Doppler flow in the area Ankle mortise with trace effusion Peroneus longus and brevis tendons unremarkable on long and transverse views mild sheath effusion. Posterior tibialis, flexor hallucis longus, and flexor digitorum longus tendons unremarkable on long and transverse views without sheath effusions. Achilles tendon visualized along length of tendon and unremarkable on long and transverse views without sheath effusion. Anterior Talofibular Ligament and Calcaneofibular Ligaments are visualized and appear to have been repaired with suture and is intact. Deltoid Ligament unremarkable and intact. Plantar fascia intact and without effusion, normal thickness. No increased doppler signal, cap sign, or thickening of tibial cortex. Power doppler signal normal.  IMPRESSION:  Successful ligamentous reconstruction of the ankle no significant fracture noted     Impression and Recommendations:     This case required medical decision making of moderate complexity.

## 2013-11-14 NOTE — Assessment & Plan Note (Signed)
Do believe the patient did have chronic instability and the ligamentous reconstruction is still intact with ultrasound today. Differential includes a Colles fracture we are no pain upon ultrasound or on x-ray today such as patellar dome fracture or potentially navicular fracture. I do feel that an MRI could be warranted with the patient has already failed all other therapies at this time. Also in the differential is patient is having under use he needs to start increasing her range of motion. Patient is going to start formal physical therapy tomorrow and I think this will be beneficial. Differential also includes unfortunately reflex sympathetic dystrophy especially with a history of patient having changes and temperature dramatically as well as changes in the coloration of the skin in this area. With patient's past medical history I think if we wait longer on treating for the potential for reflexes of a dystrophy this would become a bigger problem. Patient was started on Cymbalta low dose and likely will not titrate up on this medication. Patient warned the potential side effects and she states she has been on this medicine previously for anxiety. Patient is going to come back and see me again after the MRI and we will go over the results. Depending on that but may change our treatment and management. Will send information to orthopedic physician, Dr. Luiz BlareGraves per patient request to help work as a team.

## 2013-11-14 NOTE — Patient Instructions (Addendum)
Good to meet you We need to get MRI to rule out any fracture we can not see.  Come back 1-2 days after MRI and we will discuss.  Cymbalta 20 mg daily to reset you system.  Good with physical therapy.  Vitamin D 2000 IU daily Glucosamine 1500mg  daily Turmeric 500mg  twice daily.  Reflex sympathetic dystrophy.

## 2013-11-15 ENCOUNTER — Telehealth: Payer: Self-pay | Admitting: Family Medicine

## 2013-11-15 MED ORDER — HYDROXYZINE HCL 25 MG PO TABS: ORAL_TABLET | ORAL | Status: DC

## 2013-11-15 NOTE — Telephone Encounter (Signed)
Pt called in said that she is having the MRI and is extremely  Claustrophobic and wanted to see if Dr Katrinka Blazingsmith could have her something to clam her  Nerves

## 2013-11-15 NOTE — Telephone Encounter (Signed)
rz sent to pharmacy, pt made aware.

## 2013-11-16 ENCOUNTER — Ambulatory Visit
Admission: RE | Admit: 2013-11-16 | Discharge: 2013-11-16 | Disposition: A | Discharge status: Home / Self Care | Payer: 59 | Source: Ambulatory Visit | Attending: Family Medicine | Admitting: Family Medicine

## 2013-11-16 DIAGNOSIS — M25571 Pain in right ankle and joints of right foot: Secondary | ICD-10-CM

## 2013-11-19 ENCOUNTER — Ambulatory Visit: Payer: 59 | Admitting: Family Medicine

## 2013-11-19 ENCOUNTER — Telehealth: Payer: Self-pay | Admitting: Family Medicine

## 2013-11-19 NOTE — Telephone Encounter (Signed)
Patient had x ray and mri done Friday.  She would like to know when she needs to come back in.

## 2013-11-23 ENCOUNTER — Encounter: Payer: Self-pay | Admitting: Family Medicine

## 2013-11-23 ENCOUNTER — Ambulatory Visit (INDEPENDENT_AMBULATORY_CARE_PROVIDER_SITE_OTHER): Payer: 59 | Admitting: Family Medicine

## 2013-11-23 VITALS — BP 122/74 | HR 94 | Ht 64.0 in

## 2013-11-23 DIAGNOSIS — G90521 Complex regional pain syndrome I of right lower limb: Secondary | ICD-10-CM | POA: Insufficient documentation

## 2013-11-23 MED ORDER — PREGABALIN 50 MG PO CAPS
50.0000 mg | ORAL_CAPSULE | Freq: Three times a day (TID) | ORAL | Status: DC
Start: 2013-11-23 — End: 2013-12-12

## 2013-11-23 MED ORDER — LIDOCAINE 5 % EX PTCH: 1.0000 | MEDICATED_PATCH | CUTANEOUS | Status: AC

## 2013-11-23 NOTE — Patient Instructions (Addendum)
It is good to see you Continue the cymbalta.  lyrica 3 times daily.  Some time keeping a sock on the foot at all times can help regulate blood flow.  lidocaine patch daily for 12 hours if needed.  See me again in 2-3 weeks.  I think you are in good hands

## 2013-11-23 NOTE — Progress Notes (Signed)
[name] ScaleZach Cortnee [name] D.O. [facility] [address] [place], [place] [address] Phone: [phone](336) [phone] Subjective:      CC: Right ankle pain follow up  XLK:GMWNUUVOZDHPI:Subjective [name] RotaKaren [name] is a 48 y.o. female coming in with complaint of right ankle pain. Patient does have a past medical history significant for her opiate dependence.  Please read past history for further information. Patient is here again after the MRI showing a chronic regional pain syndrome like pattern. Patient went back to her orthopedic surgeon and was denied pain medications. Patient is upset at this and is wondering if there is anything that I can do. Patient states that she continues to have significant pain, as temperature changes in her foot almost every 3-4 hours and is having significant difficulty ambulating or standing for a long amount of time. Patient did start the over-the-counter medications as well as the Cymbalta but has not notice any improvement yet. Patient denies any new symptoms and only very minimal improvement may be with the over-the-counter medicines. Patient continues to wear the Cam Walker.  Past History:  Patient has had right ankle pain for quite some time and unfortunately had a tear of the anterior tibiofibular ligament and had continued instability of the ankle. Patient was not improving with conservative therapy including formal physical therapy in 2 corticosteroid injections. Patient actually did have ankle reconstruction by Dr. Luiz BlareGraves in July. Patient continued to have a significant amount of pain even a month after the surgery. Patient was put in a cast. Patient did wear the cast for quite some time but no significant improvement. On x-rays back in September 23, 2013 patient did have x-rays. X-rays showed no bony abnormality. Patient was seen previously and did have an MRI showing signs that are consistent with a complex regional pain syndrome.       Past medical history, social, surgical and family history  all reviewed in electronic medical record.   Review of Systems: No headache, visual changes, nausea, vomiting, diarrhea, constipation, dizziness, abdominal pain, skin rash, fevers, chills, night sweats, weight loss, swollen lymph nodes, body aches, joint swelling, muscle aches, chest pain, shortness of breath, mood changes.   Objective Blood pressure 122/74, pulse 94, height 5\' 4"  (1.626 m), SpO2 95.00%.  General: No apparent distress alert and oriented x3 mood and affect normal, dressed appropriately.  HEENT: Pupils equal, extraocular movements intact  Respiratory: Patient's speak in full sentences and does not appear short of breath  Cardiovascular: No lower extremity edema, non tender, no erythema  Skin: Warm dry intact with no signs of infection or rash on extremities or on axial skeleton.  Abdomen: Soft nontender  Neuro: Cranial nerves II through XII are intact, neurovascularly intact in all extremities with 2+ DTRs and 2+ pulses.  Lymph: No lymphadenopathy of posterior or anterior cervical chain or axillae bilaterally.  Gait normal with good balance and coordination.  MSK:  Non tender with full range of motion and good stability and symmetric strength and tone of shoulders, elbows, wrist, hip, knee and bilaterally.  Ankle: Right Effusion still noted Patient does have limited range of motion actively and passively moderately improved still lacking the last 10 of dorsiflexion and the last 102 of plantarflexion. Very mild improvement from previous exam. Patient can evert the foot fine but has pain with eversion. Patient is tender to palpation in multiple areas including talar dome, navicular prominence, as well as over the insertion of the ATFL. Patient is able to bear weight on her toes but  will not put her ankle down. Contralateral ankle unremarkable    Impression and Recommendations:     This case required medical decision making of moderate complexity.

## 2013-11-23 NOTE — Assessment & Plan Note (Addendum)
Discussed with patient again at great length. I do agree that patient's MRI does show signs of complex regional pain syndrome which does fit patient's presentation. I do agree with patient's orthopedic surgeon that opiate narcotics are not a treatment option for complex regional pain syndrome. I do believe that the specialist who deals with complex regional pain syndrome would be best for patient. Patient started been referred by orthopedic surgery. We discussed in the interim what other treatment options we had that can be beneficial. Discuss with patient I like her to continue to Cymbalta 20 mg daily and we'll add Lyrica 50 mg 3 times daily as a low dose. Patient states that she has been on this medicine previously with some decent response in the past for fibromyalgia-like syndrome. In addition to this patient was given a lidocaine patch for any type of breakthrough pain.  patient then is able to follow up with me again in 3 weeks if continuing to have difficulty or having trouble getting in with anesthesiologist.  Spent greater than 25 minutes with patient face-to-face and had greater than 50% of counseling including as described above in assessment and plan.

## 2013-11-26 ENCOUNTER — Telehealth: Payer: Self-pay | Admitting: Family Medicine

## 2013-11-26 MED ORDER — CYCLOBENZAPRINE HCL 10 MG PO TABS: 10.0000 mg | ORAL_TABLET | Freq: Three times a day (TID) | ORAL | Status: DC | PRN

## 2013-11-26 NOTE — Telephone Encounter (Signed)
Is requesting Dr. Katrinka BlazingSmith to call in a script for flexeril to [facility].

## 2013-11-26 NOTE — Telephone Encounter (Signed)
Refill done.  

## 2013-12-04 ENCOUNTER — Ambulatory Visit (INDEPENDENT_AMBULATORY_CARE_PROVIDER_SITE_OTHER): Payer: 59

## 2013-12-04 ENCOUNTER — Telehealth: Payer: Self-pay | Admitting: Family Medicine

## 2013-12-04 VITALS — BP 113/79 | HR 101 | Resp 15 | Ht 64.0 in | Wt 150.0 lb

## 2013-12-04 DIAGNOSIS — M79671 Pain in right foot: Secondary | ICD-10-CM

## 2013-12-04 DIAGNOSIS — M79661 Pain in right lower leg: Secondary | ICD-10-CM

## 2013-12-04 DIAGNOSIS — G90521 Complex regional pain syndrome I of right lower limb: Secondary | ICD-10-CM

## 2013-12-04 DIAGNOSIS — M792 Neuralgia and neuritis, unspecified: Secondary | ICD-10-CM

## 2013-12-04 DIAGNOSIS — R609 Edema, unspecified: Secondary | ICD-10-CM

## 2013-12-04 MED ORDER — CYCLOBENZAPRINE HCL 10 MG PO TABS: 10.0000 mg | ORAL_TABLET | Freq: Three times a day (TID) | ORAL | Status: DC | PRN

## 2013-12-04 NOTE — Patient Instructions (Signed)
Complex Regional Pain Syndrome Complex Regional Pain Syndrome (CRPS) is a nerve disorder that occurs at the site of an injury. It occurs especially after injuries from high-velocity impacts such as those from bullets or shrapnel. However, it may occur without apparent injury. The arms or legs are usually involved. SYMPTOMS  CRPS is a chronic condition characterized by:  Severe burning pain.  Changes in bone and skin.  Excessive sweating.  Tissue swelling.  Extreme sensitivity to touch. One visible sign of CRPS near the site of injury is warm, shiny, red skin that later becomes cool and bluish. The pain that patients report is out of proportion to the severity of the injury. The pain gets worse, rather than better, over time. Eventually the joints become stiff from disuse and the skin, muscles, and bone atrophy. The symptoms of CRPS vary in severity and duration. The cause of CRPS is unknown. The disorder is unique in that it simultaneously affects the:  Nerves.  Skin.  Muscles.  Blood vessels.  Bones. CRPS can strike at any age but is more common between the ages of 40 and 60. However, the number of CRPS cases among adolescents and young adults is increasing. CRPS is diagnosed primarily through observation of the symptoms. Some physicians use thermography to detect changes in body temperature that are common in CRPS. X-rays may also show changes in the bone.  TREATMENT  Treatments include:  Physicians use a variety of drugs to treat CRPS.  Elevation of the extremity and physical therapy are also used to treat CRPS.  Injection of a local anesthetic.  Applying brief pulses of electricity to nerve endings under the skin (transcutaneous electrical stimulation or TENS).  In some cases, interruption of the affected portion of the sympathetic nervous system (surgical or chemical sympathectomy) is necessary to relieve pain. This involves cutting the nerve or nerves. Pain is destroyed  almost instantly, but this treatment may also destroy other sensations. PROGNOSIS Good progress can be made in treating CRPS if treatment is begun early. Ideally treatment should begin within 3 months of the first symptoms. Early treatment often results in remission. If treatment is delayed, the disorder can quickly spread to the entire limb, and changes in bone and muscle may become irreversible. In 50 percent of CRPS cases, pain persists longer than 6 months and sometimes for years. Document Released: 01/08/2002 Document Revised: 04/12/2011 Document Reviewed: 11/29/2007 [employer] Patient Information 2015 [employer]. This information is not intended to replace advice given to you by your health care provider. Make sure you discuss any questions you have with your health care provider.  

## 2013-12-04 NOTE — Telephone Encounter (Signed)
Patient is requesting refill on flexeril.  Patient also states that the dosage change on lyrica is working.

## 2013-12-04 NOTE — Telephone Encounter (Signed)
Refill done.  

## 2013-12-04 NOTE — Progress Notes (Signed)
   Subjective:    Patient ID: [name] [name], female    DOB: 1965-07-26, 48 y.o.   MRN: [id number]  HPI Comments: N foot pain L right lateral foot D  O surgery 08/24/2013 C severe pain shooting, bluing, cold and edema A unable to bear weight with or without the surgical boot or cas T referred by pt's mtr-in-law, [name] Southern who is in the office with the pt  Foot Pain Associated symptoms include arthralgias, diaphoresis, myalgias and nausea.      Review of Systems  Constitutional: Positive for diaphoresis, activity change, appetite change and unexpected weight change.  Eyes: Positive for visual disturbance.  Cardiovascular: Positive for leg swelling.  Gastrointestinal: Positive for nausea.  Musculoskeletal: Positive for myalgias and arthralgias.       Calf pain with walking.  Skin: Positive for color change.  Hematological:       Slow to heal  Psychiatric/Behavioral: The patient is nervous/anxious.        PTSD  All other systems reviewed and are negative.      Objective:   Physical Exam        Assessment & Plan:

## 2013-12-12 ENCOUNTER — Ambulatory Visit (INDEPENDENT_AMBULATORY_CARE_PROVIDER_SITE_OTHER): Payer: 59 | Admitting: Family Medicine

## 2013-12-12 ENCOUNTER — Telehealth: Payer: Self-pay | Admitting: Family Medicine

## 2013-12-12 ENCOUNTER — Encounter: Payer: Self-pay | Admitting: Family Medicine

## 2013-12-12 VITALS — BP 104/68 | HR 101 | Ht 64.0 in | Wt 150.0 lb

## 2013-12-12 DIAGNOSIS — G90521 Complex regional pain syndrome I of right lower limb: Secondary | ICD-10-CM

## 2013-12-12 DIAGNOSIS — G5641 Causalgia of right upper limb: Secondary | ICD-10-CM

## 2013-12-12 MED ORDER — PREGABALIN 100 MG PO CAPS: 100.0000 mg | ORAL_CAPSULE | Freq: Three times a day (TID) | ORAL | Status: DC

## 2013-12-12 MED ORDER — TIZANIDINE HCL 4 MG PO TABS: 4.0000 mg | ORAL_TABLET | Freq: Three times a day (TID) | ORAL | Status: DC

## 2013-12-12 MED ORDER — DULOXETINE HCL 30 MG PO CPEP: 30.0000 mg | ORAL_CAPSULE | Freq: Every day | ORAL | Status: DC

## 2013-12-12 MED ORDER — PREGABALIN 100 MG PO CAPS: 100.0000 mg | ORAL_CAPSULE | Freq: Two times a day (BID) | ORAL | Status: DC

## 2013-12-12 NOTE — Telephone Encounter (Signed)
Pt called stated that Lyrica direction need to change to 3 time a day, but Dr. Katrinka BlazingSmith wrote it for twice a day. Please check. Pt used [facility]

## 2013-12-12 NOTE — Progress Notes (Signed)
Tawana ScaleZach Connelly Netterville D.O. [facility] [address] [place], [place] [address] Phone: [phone](336) [phone] Subjective:      CC: Right ankle pain follow up  BJY:NWGNFAOZHYHPI:Subjective [name] [name] is a 48 y.o. female coming in with complaint of right ankle pain. Patient does have a past medical history significant for her opiate dependence.  Please read past history for further information. Patient is here again after the MRI showing a chronic regional pain syndrome like pattern.  Patient was seen previously and was started on Cymbalta 20 mg daily as well as Lyrica. We had to titrate up on the Lyrica 200 mg 3 times a day. Patient states though that on her regular daily activities she seems to be relatively well but continues to wear the Cam Walker. Patient states that the frequency of the sharp tenderness and pain has improved. Patient states that she is not having any significant benefit with a muscle relaxer. Patient states still can have some pain but is improving. Patient states that she stands for a long amount of time she does have swelling of the ankle as well.denies any new symptoms.  Past History:  Patient has had right ankle pain for quite some time and unfortunately had a tear of the anterior tibiofibular ligament and had continued instability of the ankle. Patient was not improving with conservative therapy including formal physical therapy in 2 corticosteroid injections. Patient actually did have ankle reconstruction by Dr. Luiz BlareGraves in July. Patient continued to have a significant amount of pain even a month after the surgery. Patient was put in a cast. Patient did wear the cast for quite some time but no significant improvement. On x-rays back in September 23, 2013 patient did have x-rays. X-rays showed no bony abnormality. Patient was seen previously and did have an MRI showing signs that are consistent with a complex regional pain syndrome.       Past medical history, social, surgical and family history  all reviewed in electronic medical record.   Review of Systems: No headache, visual changes, nausea, vomiting, diarrhea, constipation, dizziness, abdominal pain, skin rash, fevers, chills, night sweats, weight loss, swollen lymph nodes, body aches, joint swelling, muscle aches, chest pain, shortness of breath, mood changes.   Objective Blood pressure 104/68, pulse 101, height 5\' 4"  (1.626 m), weight 150 lb (68.04 kg), SpO2 94 %.  General: No apparent distress alert and oriented x3 mood and affect normal, dressed appropriately.  HEENT: Pupils equal, extraocular movements intact  Respiratory: Patient's speak in full sentences and does not appear short of breath  Cardiovascular: No lower extremity edema, non tender, no erythema  Skin: Warm dry intact with no signs of infection or rash on extremities or on axial skeleton.  Abdomen: Soft nontender  Neuro: Cranial nerves II through XII are intact, neurovascularly intact in all extremities with 2+ DTRs and 2+ pulses.  Lymph: No lymphadenopathy of posterior or anterior cervical chain or axillae bilaterally.  Gait normal with good balance and coordination.  MSK:  Non tender with full range of motion and good stability and symmetric strength and tone of shoulders, elbows, wrist, hip, knee and bilaterally.  Ankle: Right No effusion noted which is significantly improved Patient does have limited range of motion actively and passively moderately improved still lacking the last 8 of dorsiflexion and the last 10 of plantarflexion. Continued improvement compared to previous exam  Patient can evert the foot fine but has pain with eversion.  Patient [facility] which  is an improvement. Contralateral ankle unremarkable    Impression and Recommendations:     This case required medical decision making of moderate complexity.

## 2013-12-12 NOTE — Patient Instructions (Addendum)
Good to see you and I am changing a couple things.  Stop the flexeril  Increase the cymbalta to 30 mg daily.  Continue the lyrica 100mg  3 times daily Break through physical therapy.  See me again in 4 weeks.

## 2013-12-12 NOTE — Assessment & Plan Note (Signed)
Patient is responding to the conservative therapies.  I to titrate patient Cymbalta up to 30 mg daily, new reduction given. Patient given a prescription for Lyrica 100 mg 3 times daily. We also changed patient muscle relaxer tizanidine.   New prescription given. In addition this will send to PT RTC in 4 weeks. ? Sympathetic injections if all else fails.

## 2013-12-12 NOTE — Telephone Encounter (Signed)
Faxed rx to pharmacy  

## 2013-12-12 NOTE — Telephone Encounter (Signed)
Will call it in

## 2013-12-31 ENCOUNTER — Encounter (HOSPITAL_COMMUNITY): Payer: Self-pay | Admitting: *Deleted

## 2013-12-31 ENCOUNTER — Emergency Department (HOSPITAL_COMMUNITY)
Admission: EM | Admit: 2013-12-31 | Discharge: 2013-12-31 | Disposition: A | Discharge status: Home / Self Care | Payer: 59 | Attending: Emergency Medicine | Admitting: Emergency Medicine

## 2013-12-31 ENCOUNTER — Emergency Department (HOSPITAL_COMMUNITY): Payer: 59

## 2013-12-31 DIAGNOSIS — Z91040 Latex allergy status: Secondary | ICD-10-CM | POA: Insufficient documentation

## 2013-12-31 DIAGNOSIS — G8929 Other chronic pain: Secondary | ICD-10-CM | POA: Diagnosis not present

## 2013-12-31 DIAGNOSIS — Z79899 Other long term (current) drug therapy: Secondary | ICD-10-CM | POA: Diagnosis not present

## 2013-12-31 DIAGNOSIS — R059 Cough, unspecified: Secondary | ICD-10-CM

## 2013-12-31 DIAGNOSIS — R000 Tachycardia, unspecified: Secondary | ICD-10-CM | POA: Diagnosis not present

## 2013-12-31 DIAGNOSIS — R05 Cough: Secondary | ICD-10-CM | POA: Diagnosis present

## 2013-12-31 DIAGNOSIS — Z87442 Personal history of urinary calculi: Secondary | ICD-10-CM | POA: Diagnosis not present

## 2013-12-31 DIAGNOSIS — Z8701 Personal history of pneumonia (recurrent): Secondary | ICD-10-CM | POA: Insufficient documentation

## 2013-12-31 DIAGNOSIS — F419 Anxiety disorder, unspecified: Secondary | ICD-10-CM | POA: Diagnosis not present

## 2013-12-31 DIAGNOSIS — G4700 Insomnia, unspecified: Secondary | ICD-10-CM | POA: Insufficient documentation

## 2013-12-31 DIAGNOSIS — Z720 Tobacco use: Secondary | ICD-10-CM | POA: Diagnosis not present

## 2013-12-31 DIAGNOSIS — Z8742 Personal history of other diseases of the female genital tract: Secondary | ICD-10-CM | POA: Diagnosis not present

## 2013-12-31 DIAGNOSIS — Z8639 Personal history of other endocrine, nutritional and metabolic disease: Secondary | ICD-10-CM | POA: Diagnosis not present

## 2013-12-31 DIAGNOSIS — Z880 Allergy status to penicillin: Secondary | ICD-10-CM | POA: Insufficient documentation

## 2013-12-31 DIAGNOSIS — J40 Bronchitis, not specified as acute or chronic: Secondary | ICD-10-CM

## 2013-12-31 DIAGNOSIS — K219 Gastro-esophageal reflux disease without esophagitis: Secondary | ICD-10-CM | POA: Diagnosis not present

## 2013-12-31 MED ORDER — ALBUTEROL SULFATE HFA 108 (90 BASE) MCG/ACT IN AERS
2.0000 | INHALATION_SPRAY | RESPIRATORY_TRACT | Status: DC | PRN
  Administered 2013-12-31: 2 via RESPIRATORY_TRACT
  Filled 2013-12-31: qty 6.7

## 2013-12-31 MED ORDER — BENZONATATE 100 MG PO CAPS: 100.0000 mg | ORAL_CAPSULE | Freq: Three times a day (TID) | ORAL | Status: DC | PRN

## 2013-12-31 MED ORDER — BENZONATATE 100 MG PO CAPS
100.0000 mg | ORAL_CAPSULE | Freq: Once | ORAL | Status: AC
  Administered 2013-12-31: 100 mg via ORAL
  Filled 2013-12-31: qty 1

## 2013-12-31 MED ORDER — PREDNISONE 20 MG PO TABS: ORAL_TABLET | ORAL | Status: DC

## 2013-12-31 MED ORDER — HYDROXYZINE HCL 25 MG PO TABS: ORAL_TABLET | ORAL | Status: DC

## 2013-12-31 NOTE — Discharge Instructions (Signed)
Use inhaler, 1-2 puff every 4 hours as needed for cough or shortness of breath.  Take vistaril for anxiety.  Take steroid for the full course.  Follow up with your doctor for further care.  Upper Respiratory Infection, Adult An upper respiratory infection (URI) is also sometimes known as the common cold. The upper respiratory tract includes the nose, sinuses, throat, trachea, and bronchi. Bronchi are the airways leading to the lungs. Most people improve within 1 week, but symptoms can last up to 2 weeks. A residual cough may last even longer.  CAUSES Many different viruses can infect the tissues lining the upper respiratory tract. The tissues become irritated and inflamed and often become very moist. Mucus production is also common. A cold is contagious. You can easily spread the virus to others by oral contact. This includes kissing, sharing a glass, coughing, or sneezing. Touching your mouth or nose and then touching a surface, which is then touched by another person, can also spread the virus. SYMPTOMS  Symptoms typically develop 1 to 3 days after you come in contact with a cold virus. Symptoms vary from person to person. They may include:  Runny nose.  Sneezing.  Nasal congestion.  Sinus irritation.  Sore throat.  Loss of voice (laryngitis).  Cough.  Fatigue.  Muscle aches.  Loss of appetite.  Headache.  Low-grade fever. DIAGNOSIS  You might diagnose your own cold based on familiar symptoms, since most people get a cold 2 to 3 times a year. Your caregiver can confirm this based on your exam. Most importantly, your caregiver can check that your symptoms are not due to another disease such as strep throat, sinusitis, pneumonia, asthma, or epiglottitis. Blood tests, throat tests, and X-rays are not necessary to diagnose a common cold, but they may sometimes be helpful in excluding other more serious diseases. Your caregiver will decide if any further tests are required. RISKS AND  COMPLICATIONS  You may be at risk for a more severe case of the common cold if you smoke cigarettes, have chronic heart disease (such as heart failure) or lung disease (such as asthma), or if you have a weakened immune system. The very young and very old are also at risk for more serious infections. Bacterial sinusitis, middle ear infections, and bacterial pneumonia can complicate the common cold. The common cold can worsen asthma and chronic obstructive pulmonary disease (COPD). Sometimes, these complications can require emergency medical care and may be life-threatening. PREVENTION  The best way to protect against getting a cold is to practice good hygiene. Avoid oral or hand contact with people with cold symptoms. Wash your hands often if contact occurs. There is no clear evidence that vitamin C, vitamin E, echinacea, or exercise reduces the chance of developing a cold. However, it is always recommended to get plenty of rest and practice good nutrition. TREATMENT  Treatment is directed at relieving symptoms. There is no cure. Antibiotics are not effective, because the infection is caused by a virus, not by bacteria. Treatment may include:  Increased fluid intake. Sports drinks offer valuable electrolytes, sugars, and fluids.  Breathing heated mist or steam (vaporizer or shower).  Eating chicken soup or other clear broths, and maintaining good nutrition.  Getting plenty of rest.  Using gargles or lozenges for comfort.  Controlling fevers with ibuprofen or acetaminophen as directed by your caregiver.  Increasing usage of your inhaler if you have asthma. Zinc gel and zinc lozenges, taken in the first 24 hours of the common  cold, can shorten the duration and lessen the severity of symptoms. Pain medicines may help with fever, muscle aches, and throat pain. A variety of non-prescription medicines are available to treat congestion and runny nose. Your caregiver can make recommendations and may  suggest nasal or lung inhalers for other symptoms.  HOME CARE INSTRUCTIONS   Only take over-the-counter or prescription medicines for pain, discomfort, or fever as directed by your caregiver.  Use a warm mist humidifier or inhale steam from a shower to increase air moisture. This may keep secretions moist and make it easier to breathe.  Drink enough water and fluids to keep your urine clear or pale yellow.  Rest as needed.  Return to work when your temperature has returned to normal or as your caregiver advises. You may need to stay home longer to avoid infecting others. You can also use a face mask and careful hand washing to prevent spread of the virus. SEEK MEDICAL CARE IF:   After the first few days, you feel you are getting worse rather than better.  You need your caregiver's advice about medicines to control symptoms.  You develop chills, worsening shortness of breath, or brown or red sputum. These may be signs of pneumonia.  You develop yellow or brown nasal discharge or pain in the face, especially when you bend forward. These may be signs of sinusitis.  You develop a fever, swollen neck glands, pain with swallowing, or white areas in the back of your throat. These may be signs of strep throat. SEEK IMMEDIATE MEDICAL CARE IF:   You have a fever.  You develop severe or persistent headache, ear pain, sinus pain, or chest pain.  You develop wheezing, a prolonged cough, cough up blood, or have a change in your usual mucus (if you have chronic lung disease).  You develop sore muscles or a stiff neck. Document Released: 07/14/2000 Document Revised: 04/12/2011 Document Reviewed: 04/25/2013 [facility] Patient Information 2015 [place], [place]. This information is not intended to replace advice given to you by your health care provider. Make sure you discuss any questions you have with your health care provider.

## 2013-12-31 NOTE — ED Provider Notes (Signed)
CSN: [id number]     Arrival date & time 12/31/13  1611 History  This chart was scribed for non-physician practitioner, Fayrene HelperBowie Ellar Hakala, PA-C working with Geoffery Lyonsouglas Delo, MD by Greggory StallionKayla Andersen, ED scribe. This patient was seen in room TR06C/TR06C and the patient's care was started at 4:26 PM.   Chief Complaint  Patient presents with  . URI   The history is provided by the patient. No language interpreter was used.    HPI Comments: [name] [name] is a 48 y.o. female who presents to the Emergency Department complaining of productive cough that started 2 weeks ago. Cough is worse at night. Also reports intermittent fever, chest tenderness with cough, and mid back pain. Fever has been 101.6 at home. Laying down worsens symptoms but sitting forward provides some relief. She has taken Tylenol for symptoms with no relief. States she has been having increased anxiety recently and thinks it is due to the cough. She has an appointment with her PCP in 3 days. Pt is out of her anxiety medication and is requesting a refill until she can see her PCP. Denies chills, rhinorrhea, sore throat, sneezing. Denies history of DVT or pulmonary embolism. Denies recent travel but states she had ankle surgery 4 months ago. Pt smokes cigarettes daily. Denies history of asthma or COPD.   PCP is Dr. Antony HasteMichael Badger  Past Medical History  Diagnosis Date  . Anxiety   . Thyroid disease   . Substance abuse   . Pneumonia   . Bronchitis   . Kidney stone   . Endometriosis   . Chronic pain   . GERD (gastroesophageal reflux disease)   . Gastritis, chronic   . PONV (postoperative nausea and vomiting)   . Insomnia    Past Surgical History  Procedure Laterality Date  . Abdominal hysterectomy    . Cholecystectomy    . Appendectomy    . Ex laporotomy      x2-adhesion  . Knee arthroplasty      left knee age 89  . Finger arthrodesis      left ring  . Ankle reconstruction Right 08/24/2013    Procedure: RECONSTRUCTION ANKLE;  Surgeon:  Harvie JuniorJohn L Graves, MD;  Location: [facility];  Service: Orthopedics;  Laterality: Right;   Family History  Problem Relation Age of Onset  . Thyroid disease Mother   . CAD Mother   . Thyroid disease Sister   . CAD Other    History  Substance Use Topics  . Smoking status: Current Every Day Smoker -- 0.50 packs/day  . Smokeless tobacco: Never Used  . Alcohol Use: No     Comment: socially-no now   OB History    No data available     Review of Systems  Constitutional: Positive for fever. Negative for chills.  HENT: Negative for rhinorrhea, sneezing and sore throat.   Respiratory: Positive for cough.   Musculoskeletal: Positive for back pain.  All other systems reviewed and are negative.  Allergies  Sulfa antibiotics; Reglan; Clindamycin/lincomycin; Compazine; Latex; Morphine and related; Penicillins; and Zofran  Home Medications   Prior to Admission medications   Medication Sig Start Date End Date Taking? Authorizing Provider  clonazePAM (KLONOPIN) 1 MG tablet Take 1 mg by mouth 2 (two) times daily.     Historical Provider, MD  DULoxetine (CYMBALTA) 30 MG capsule Take 1 capsule (30 mg total) by mouth daily. 12/12/13   Judi SaaZachary M Smith, DO  hydrOXYzine (ATARAX/VISTARIL) 25 MG tablet Take 1 tablet 1-2  hours before MRI 11/15/13   Judi SaaZachary M Smith, DO  ibuprofen (ADVIL,MOTRIN) 800 MG tablet Take 800 mg by mouth every 8 (eight) hours as needed for moderate pain.    Historical Provider, MD  lidocaine (LIDODERM) 5 % Place 1 patch onto the skin daily. Remove & Discard patch within 12 hours or as directed by MD 11/23/13   Judi SaaZachary M Smith, DO  omeprazole (PRILOSEC OTC) 20 MG tablet Take 20 mg by mouth daily.    Historical Provider, MD  prazosin (MINIPRESS) 1 MG capsule Take 3 mg by mouth at bedtime.     Historical Provider, MD  pregabalin (LYRICA) 100 MG capsule Take 1 capsule (100 mg total) by mouth 3 (three) times daily. 12/12/13   Judi SaaZachary M Smith, DO  promethazine (PHENERGAN)  25 MG tablet Take 25 mg by mouth every 6 (six) hours as needed for nausea or vomiting.    Historical Provider, MD  QUEtiapine (SEROQUEL) 25 MG tablet Take 25 mg by mouth at bedtime as needed. For sleep 07/02/13   Historical Provider, MD  sucralfate (CARAFATE) 1 G tablet Take 1 g by mouth daily as needed (for gastritis).     Historical Provider, MD  tiZANidine (ZANAFLEX) 4 MG tablet Take 1 tablet (4 mg total) by mouth 3 (three) times daily. 12/12/13   Judi SaaZachary M Smith, DO  traZODone (DESYREL) 50 MG tablet Take 50 mg by mouth at bedtime as needed for sleep.    Historical Provider, MD   BP 134/81 mmHg  Pulse 101  Temp(Src) 97.9 F (36.6 C) (Oral)  Resp 18  SpO2 97%   Physical Exam  Constitutional: She is oriented to person, place, and time. She appears well-developed and well-nourished. No distress.  HENT:  Head: Normocephalic and atraumatic.  Mouth/Throat: Uvula is midline. Posterior oropharyngeal erythema present.  Mild post oropharyngeal erythema. No abscess.  Eyes: Conjunctivae and EOM are normal.  Neck: Neck supple. No tracheal deviation present.  Cardiovascular: Regular rhythm and normal heart sounds.  Tachycardia present.  Exam reveals no gallop and no friction rub.   No murmur heard. Mild tachycardia.  Pulmonary/Chest: Effort normal. No respiratory distress. She has no wheezes. She has rhonchi. She has no rales.  Mild rhonchi.  Musculoskeletal: Normal range of motion.  Neurological: She is alert and oriented to person, place, and time.  Skin: Skin is warm and dry.  Psychiatric: She has a normal mood and affect. Her behavior is normal.  Nursing note and vitals reviewed.   ED Course  Procedures (including critical care time)  DIAGNOSTIC STUDIES: Oxygen Saturation is 97% on RA, normal by my interpretation.    COORDINATION OF CARE: 4:31 PM-Discussed treatment plan which includes chest xray with pt at bedside and pt agreed to plan.   5:17 PM cxr without pna, but evidence of  mild inflammation.  Given that she is a smoker, i prescribe steroid, albuterol inhaler and cough medication as treatment.  Pt report having baseline anxiety, will prescribed vistaril as needed.  Low suspicion for PE.  Pt to f;u with pcp.  Labs Review Labs Reviewed - No data to display  Imaging Review Dg Chest 2 View  12/31/2013   CLINICAL DATA:  Cough.  EXAM: CHEST  2 VIEW  COMPARISON:  None.  FINDINGS: Mediastinum and hilar structures are normal. Mild interstitial prominence noted. No pleural effusion or pneumothorax. No acute bony abnormality.  IMPRESSION: Mild interstitial prominence.  Mild pneumonitis cannot be excluded.   Electronically Signed   By: Maisie Fushomas  Register  On: 12/31/2013 17:06     EKG Interpretation None      MDM   Final diagnoses:  Bronchitis  Anxiety    BP 134/81 mmHg  Pulse 101  Temp(Src) 97.9 F (36.6 C) (Oral)  Resp 18  SpO2 97%  I have reviewed nursing notes and vital signs. I personally reviewed the imaging tests through PACS system  I reviewed available ER/hospitalization records thought the EMR   I personally performed the services described in this documentation, which was scribed in my presence. The recorded information has been reviewed and is accurate.  Fayrene Helper, PA-C 12/31/13 1719  Geoffery Lyons, MD 01/01/14 [phone]

## 2013-12-31 NOTE — ED Notes (Signed)
Declined W/C at D/C and was escorted to lobby by RN. 

## 2013-12-31 NOTE — ED Notes (Signed)
Pt in c/o productive cough for the last two weeks, symptoms worse at night, intermittent fever, pain with coughing

## 2013-12-31 NOTE — ED Notes (Signed)
Patient transported to X-ray 

## 2014-01-09 ENCOUNTER — Ambulatory Visit: Payer: 59 | Admitting: Family Medicine

## 2014-01-09 ENCOUNTER — Other Ambulatory Visit: Payer: Self-pay | Admitting: Family Medicine

## 2014-01-10 NOTE — Telephone Encounter (Signed)
Refill done.  

## 2014-01-22 ENCOUNTER — Ambulatory Visit: Payer: 59 | Admitting: Family Medicine

## 2014-01-30 ENCOUNTER — Encounter (HOSPITAL_COMMUNITY): Payer: Self-pay

## 2014-01-30 ENCOUNTER — Emergency Department (HOSPITAL_COMMUNITY)
Admission: EM | Admit: 2014-01-30 | Discharge: 2014-01-31 | Disposition: A | Discharge status: Home / Self Care | Payer: 59 | Attending: Emergency Medicine | Admitting: Emergency Medicine

## 2014-01-30 DIAGNOSIS — F1310 Sedative, hypnotic or anxiolytic abuse, uncomplicated: Secondary | ICD-10-CM | POA: Insufficient documentation

## 2014-01-30 DIAGNOSIS — Z91040 Latex allergy status: Secondary | ICD-10-CM | POA: Insufficient documentation

## 2014-01-30 DIAGNOSIS — Z87442 Personal history of urinary calculi: Secondary | ICD-10-CM | POA: Insufficient documentation

## 2014-01-30 DIAGNOSIS — Z8742 Personal history of other diseases of the female genital tract: Secondary | ICD-10-CM | POA: Insufficient documentation

## 2014-01-30 DIAGNOSIS — F32A Depression, unspecified: Secondary | ICD-10-CM

## 2014-01-30 DIAGNOSIS — Z880 Allergy status to penicillin: Secondary | ICD-10-CM | POA: Insufficient documentation

## 2014-01-30 DIAGNOSIS — Z8701 Personal history of pneumonia (recurrent): Secondary | ICD-10-CM | POA: Diagnosis not present

## 2014-01-30 DIAGNOSIS — G4700 Insomnia, unspecified: Secondary | ICD-10-CM | POA: Insufficient documentation

## 2014-01-30 DIAGNOSIS — G894 Chronic pain syndrome: Secondary | ICD-10-CM | POA: Diagnosis not present

## 2014-01-30 DIAGNOSIS — F419 Anxiety disorder, unspecified: Secondary | ICD-10-CM | POA: Insufficient documentation

## 2014-01-30 DIAGNOSIS — Z720 Tobacco use: Secondary | ICD-10-CM | POA: Insufficient documentation

## 2014-01-30 DIAGNOSIS — Z79899 Other long term (current) drug therapy: Secondary | ICD-10-CM | POA: Insufficient documentation

## 2014-01-30 DIAGNOSIS — Z8719 Personal history of other diseases of the digestive system: Secondary | ICD-10-CM | POA: Insufficient documentation

## 2014-01-30 DIAGNOSIS — F329 Major depressive disorder, single episode, unspecified: Secondary | ICD-10-CM | POA: Insufficient documentation

## 2014-01-30 DIAGNOSIS — Z8639 Personal history of other endocrine, nutritional and metabolic disease: Secondary | ICD-10-CM | POA: Diagnosis not present

## 2014-01-30 DIAGNOSIS — R45851 Suicidal ideations: Secondary | ICD-10-CM | POA: Diagnosis present

## 2014-01-30 DIAGNOSIS — Z8709 Personal history of other diseases of the respiratory system: Secondary | ICD-10-CM | POA: Insufficient documentation

## 2014-01-30 DIAGNOSIS — Z7952 Long term (current) use of systemic steroids: Secondary | ICD-10-CM | POA: Diagnosis not present

## 2014-01-30 NOTE — ED Notes (Signed)
Pt presents from home via EMS with c/o depression and thoughts of suicide. Pt also reports she has taken 20 fioricets in the past 36 hours, last one was 9:30 tonight because she has bad cluster headaches and has been having them worse than usual. Pt denies any relation to taking the medication to her SI.

## 2014-01-30 NOTE — ED Notes (Signed)
Bed: WLPT3 Expected date: 01/30/14 Expected time: 10:47 PM Means of arrival: Ambulance Comments: 48 yo F  Suicidal  Triage

## 2014-01-31 ENCOUNTER — Ambulatory Visit: Payer: 59 | Admitting: Family Medicine

## 2014-01-31 DIAGNOSIS — F329 Major depressive disorder, single episode, unspecified: Secondary | ICD-10-CM | POA: Insufficient documentation

## 2014-01-31 DIAGNOSIS — F419 Anxiety disorder, unspecified: Secondary | ICD-10-CM

## 2014-01-31 DIAGNOSIS — F32A Depression, unspecified: Secondary | ICD-10-CM | POA: Insufficient documentation

## 2014-01-31 DIAGNOSIS — F332 Major depressive disorder, recurrent severe without psychotic features: Secondary | ICD-10-CM | POA: Insufficient documentation

## 2014-01-31 LAB — RAPID URINE DRUG SCREEN, HOSP PERFORMED
Amphetamines: NOT DETECTED
BENZODIAZEPINES: POSITIVE — AB
Barbiturates: POSITIVE — AB
Cocaine: NOT DETECTED
OPIATES: NOT DETECTED
Tetrahydrocannabinol: NOT DETECTED

## 2014-01-31 LAB — COMPREHENSIVE METABOLIC PANEL
ALBUMIN: 4.1 g/dL (ref 3.5–5.2)
ALT: 26 U/L (ref 0–35)
AST: 27 U/L (ref 0–37)
Alkaline Phosphatase: 155 U/L — ABNORMAL HIGH (ref 39–117)
Anion gap: 10 (ref 5–15)
BUN: 5 mg/dL — AB (ref 6–23)
CALCIUM: 9.3 mg/dL (ref 8.4–10.5)
CO2: 25 mmol/L (ref 19–32)
Chloride: 99 mEq/L (ref 96–112)
Creatinine, Ser: 0.5 mg/dL (ref 0.50–1.10)
GFR calc Af Amer: >90 mL/min (ref >90)
Glucose, Bld: 92 mg/dL (ref 70–99)
POTASSIUM: 3.1 mmol/L — AB (ref 3.5–5.1)
Sodium: 134 mmol/L — ABNORMAL LOW (ref 135–145)
Total Bilirubin: 0.3 mg/dL (ref 0.3–1.2)
Total Protein: 7.4 g/dL (ref 6.0–8.3)

## 2014-01-31 LAB — ACETAMINOPHEN LEVEL: Acetaminophen (Tylenol), Serum: <10 ug/mL — ABNORMAL LOW (ref 10–30)

## 2014-01-31 LAB — CBC
HCT: 37.1 % (ref 36.0–46.0)
Hemoglobin: 12.8 g/dL (ref 12.0–15.0)
MCH: 29.6 pg (ref 26.0–34.0)
MCHC: 34.5 g/dL (ref 30.0–36.0)
MCV: 85.9 fL (ref 78.0–100.0)
Platelets: 232 10*3/uL (ref 150–400)
RBC: 4.32 MIL/uL (ref 3.87–5.11)
RDW: 13 % (ref 11.5–15.5)
WBC: 13.2 10*3/uL — ABNORMAL HIGH (ref 4.0–10.5)

## 2014-01-31 LAB — ETHANOL: Alcohol, Ethyl (B): <5 mg/dL (ref 0–9)

## 2014-01-31 LAB — SALICYLATE LEVEL

## 2014-01-31 MED ORDER — BUTALBITAL-APAP-CAFFEINE 50-325-40 MG PO TABS: 1.0000 | ORAL_TABLET | Freq: Three times a day (TID) | ORAL | Status: DC | PRN

## 2014-01-31 MED ORDER — PREGABALIN 50 MG PO CAPS
100.0000 mg | ORAL_CAPSULE | Freq: Three times a day (TID) | ORAL | Status: DC
Start: 2014-01-31 — End: 2014-01-31
  Administered 2014-01-31: 100 mg via ORAL
  Filled 2014-01-31: qty 2

## 2014-01-31 MED ORDER — ZOLPIDEM TARTRATE 10 MG PO TABS: 10.0000 mg | ORAL_TABLET | Freq: Every day | ORAL | Status: DC

## 2014-01-31 MED ORDER — POTASSIUM CHLORIDE CRYS ER 20 MEQ PO TBCR
40.0000 meq | EXTENDED_RELEASE_TABLET | Freq: Once | ORAL | Status: AC
  Administered 2014-01-31: 40 meq via ORAL
  Filled 2014-01-31: qty 2

## 2014-01-31 MED ORDER — CLONAZEPAM 1 MG PO TABS
1.0000 mg | ORAL_TABLET | Freq: Two times a day (BID) | ORAL | Status: DC
  Administered 2014-01-31: 1 mg via ORAL
  Filled 2014-01-31: qty 2

## 2014-01-31 MED ORDER — ALUM & MAG HYDROXIDE-SIMETH 200-200-20 MG/5ML PO SUSP: 30.0000 mL | ORAL | Status: DC | PRN

## 2014-01-31 MED ORDER — HYDROXYZINE HCL 25 MG PO TABS: 25.0000 mg | ORAL_TABLET | Freq: Four times a day (QID) | ORAL | Status: DC | PRN

## 2014-01-31 MED ORDER — ZOLPIDEM TARTRATE 5 MG PO TABS: 5.0000 mg | ORAL_TABLET | Freq: Every evening | ORAL | Status: DC | PRN

## 2014-01-31 MED ORDER — ACETAMINOPHEN 325 MG PO TABS: 650.0000 mg | ORAL_TABLET | ORAL | Status: DC | PRN

## 2014-01-31 MED ORDER — DULOXETINE HCL 30 MG PO CPEP
30.0000 mg | ORAL_CAPSULE | Freq: Every day | ORAL | Status: DC
  Filled 2014-01-31: qty 1

## 2014-01-31 MED ORDER — NICOTINE 21 MG/24HR TD PT24
21.0000 mg | MEDICATED_PATCH | Freq: Every day | TRANSDERMAL | Status: DC
Start: 2014-01-31 — End: 2014-01-31

## 2014-01-31 NOTE — BH Assessment (Addendum)
[facility] Assessment Progress Note  Per Corena Pilgrim, MD, pt is to be discharged from [facility] with referrals for outpatient psychiatry and for a Suboxone provider.  However he requests that I first speak to her Claudia Desanctis [phone]), for collateral information.  Attempts to reach him by telephone were made at 12:30 and at 13:30 without success.  However, it was then discovered that pt was in the [facility] and [place] visiting.  I spoke to the fiance privately.  Stressors: Elta Guadeloupe reports that pt has had some health problems over the past year, including bronchitis in 2014.  Her main problem, however, stems from an ankle surgery in 08/2013 which has left her with complex regional pain syndrome, resulting in chronic pain problems.  Last night Elta Guadeloupe reports that pt appeared to be drowsy, and he asked her about her medication use.  He mistakenly believed that she had taken a large quantity all at once, rather than over several days.  Elta Guadeloupe and his mother then threatened to go to [employer] and petition for her to be involuntarily committed, unless she agreed to go to the ED by EMS.  Pt therefore believed that she was under IVC, when in fact she is under voluntary status.  Lethality: Suicidality: With regard to SI, Elta Guadeloupe reports that pt has been "talking about it a little bit," for the past couple days.  This mostly consisted of passive statements, such as, "I'm not going to live like this anymore."  However, last night she also talked about obtaining a bag of heroin and killing herself by overdose.  He reports that the pt has not written any suicide notes or posted any such messages on social media.  She has not been hoarding supplies to use to end her life and has not been researching effective suicide methods.  She has not been giving away valued possessions.  She has no history of self mutilation.  He believes that the pt may have made a suicide attempt in her 20's, but none since.  Recently pt has demonstrated  depression including sad mood, irritability, and insomnia, along with mild hopelessness, worthlessness and tearfulness.  However, Elta Guadeloupe reports that pt seems brighter today and he believes that pt is safe to return home. Homicidality: Elta Guadeloupe denies that pt has endorsed any HI.  She has had no problems with physical aggression toward others, and she is facing no legal problems. Psychosis: Elta Guadeloupe reports that pt has shown no signs of hallucinations or delusional thought. Substance Abuse: Elta Guadeloupe reports that pt does not use alcohol or any street drugs.  She will not even use cannabis with him.  Social Supports: Pt lives with Elta Guadeloupe and his mother.  They intend to establish their own household in the near future.  They met while in a rehab program.  Pt has worked as a Engineer, site in the past.  Treatment History: Per Elta Guadeloupe, pt has never been hospitalized for psychiatric treatment in the past.  She has seen a psychiatrist, Dr Edgar Frisk, in the recent past, but she did not care for him.  She has been scheduled for an intake appointment for Suboxone treatment, and eventually psychiatry, at [facility].  Jalene Mullet, MA Triage Specialist 01/31/2014 @ 15:04

## 2014-01-31 NOTE — Progress Notes (Signed)
Pt discussed during daily [facility]  CM followed up with Dr badger office (spoke with [place]) and at [facility] (spoke with Tinnie GensJennie)  Entered information in d/c instructions for pt Updated [facility] MD/PA/NP  Follow-up With Details Why Contact Info Center, [facility] has received the referral from Dr Cyndia BentBadger office and awaiting response from [facility] your insurance carrier. They will call to make and appointment with you but you may call [phone] to get an update as needed [address] [place] [address] [phone] Eartha InchMichael C Badger, MD for follow up appointment with your pcp or family doctor as needed [address] [place] [id number] [phone]

## 2014-01-31 NOTE — ED Notes (Signed)
TTS OUT OF ROOM WILL ATTEMPT REPORT

## 2014-01-31 NOTE — ED Notes (Signed)
Patient discharged to home.  Denies thoughts of harm to self or others.  All belongings returned.  Patient left the unit ambulatory with fiance.

## 2014-01-31 NOTE — ED Notes (Signed)
MD at bedside. TTS AT BS. 

## 2014-01-31 NOTE — ED Notes (Signed)
[facility] BS SPEAKING WITH PT

## 2014-01-31 NOTE — ED Notes (Signed)
Pt has medicine bottles in her purse as follows:   Two bottles of tizanidine 4 mg - 3 tablets in one and 51 in the second One bottle of But/APAP/caf 50-325-40- 12 tablets  One bottle of benzonatate 100 mg- 3 tablets One bottle of buprenorphine/naloxone 5.7-1.4 mg- 7.5 tablets  One bottle of tylenol  One bottle of clonazepam 1mg  - 5 .5 tablets One bottle of lyrica 100mg  68 tablets One empty zolpidem bottle One empty promethazine bottle One empty But/APAP/caf bottle

## 2014-01-31 NOTE — Consult Note (Signed)
[facility] Consult   Reason for Consult:  Suicidal thought, major depression Referring Physician:  EDP [name] [name] is an 48 y.o. female. Total Time spent with patient: 1 hour Assessment: DSM5 Major Depression, Recurrent severe without psychotic features and Anxiety Disorder NOS    Past Medical History  Diagnosis Date  . Anxiety   . Thyroid disease   . Substance abuse   . Pneumonia   . Bronchitis   . Kidney stone   . Endometriosis   . Chronic pain   . GERD (gastroesophageal reflux disease)   . Gastritis, chronic   . PONV (postoperative nausea and vomiting)   . Insomnia      Plan:  No evidence of imminent risk to self or others at present.   Discharge and follow up with Pain clinic and outpatient Psychiatrist  Subjective:   [name] [name] is a 48 y.o. female patient admitted with Major depressive disorder, recurrent severe.  HPI: Caucasian female, 89 was seen for Opioid OD which she stated was not to kill her self.  She was brought in by EMS from a home she shares with her fiance and his mother after taking 20 tablets of Fioricet over 36 hours period.  Patient vehemently denied that it was not to kill herself and she denied previous attempt to kill her self.  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  Patient reported that she is angry and depressed due to chronic  Complex regional pain syndrome after ankle surgery.  Patient states she is taking Fioricet for bad Migraine headache  Which his PMD is prescribing with Clonazepam.  His former Psychiatrist was prescribing Suboxone.  Patient stated that her visit to the ER is to seek pain management care.  Patient stated that she is depressed because of her pain situation.  She denies SI/HI/AVH and she is asking to be discharged to follow up with a pain clinic his PMD referred her to.  She does not want to see her former Teacher, music and is requesting for a new provider.  We will discharge patient home with  resources for a Psychiatrist and an appointment is made for her to see pain  Clinic provider on Monday.  Please see Collateral information note by another provider from her Fiance.  HPI Elements:   Location:  Major depression, anxiety, Opioid abuse, Chronic pain. Quality:  severe, accidental OD on Fiorecet , angry, in pain all the time.. Severity:  severe. Duration:  Chronic pain situation. Context:  Seeking treatment for pain and depression..  Past Psychiatric History: Past Medical History  Diagnosis Date  . Anxiety   . Thyroid disease   . Substance abuse   . Pneumonia   . Bronchitis   . Kidney stone   . Endometriosis   . Chronic pain   . GERD (gastroesophageal reflux disease)   . Gastritis, chronic   . PONV (postoperative nausea and vomiting)   . Insomnia     reports that she has been smoking.  She has never used smokeless tobacco. She reports that she uses illicit drugs (Opium). She reports that she does not drink alcohol. Family History  Problem Relation Age of Onset  . Thyroid disease Mother   . CAD Mother   . Thyroid disease Sister   . CAD Other    Family History Substance Abuse: No Family Supports: Yes, List: Living Arrangements: Spouse/significant other Can pt return to current living arrangement?: Yes Abuse/Neglect [facility]) Physical Abuse: Denies Verbal Abuse: Denies Sexual Abuse:  Denies Allergies:   Allergies  Allergen Reactions  . Sulfa Antibiotics Anaphylaxis  . Reglan [Metoclopramide] Nausea And Vomiting and Other (See Comments)    hallucinations   . Clindamycin/Lincomycin Nausea Only and Other (See Comments)    GI upset  . Compazine [Prochlorperazine Edisylate] Other (See Comments)    Restless leg  . Latex Hives  . Morphine And Related Rash    Can have with benadryl  . Penicillins Hives, Nausea Only and Rash  . Zofran [Ondansetron Hcl] Hives and Nausea And Vomiting    Can have benadryl with it     ACT Assessment Complete:  Yes:    Educational  Status    Risk to Self: Risk to self with the past 6 months Suicidal Ideation: Yes-Currently Present Suicidal Intent: Yes-Currently Present Is patient at risk for suicide?: Yes Suicidal Plan?: Yes-Currently Present Specify Current Suicidal Plan:  (OD) Access to Means: Yes Specify Access to Suicidal Means:  (patient has access to pills ) What has been your use of drugs/alcohol within the last 12 months?:  (patient reports taking RX suboxone (hx of opiate abuse)) Previous Attempts/Gestures: Yes How many times?:  (1 prior OD yrs ago ) Other Self Harm Risks: none reported  Triggers for Past Attempts: Other (Comment) (chronic pain issues ) Intentional Self Injurious Behavior: None Family Suicide History: Yes ("My great grandmother was crazy") Recent stressful life event(s): Other (Comment) (on-going chronic pain, medical, limited walking ) Persecutory voices/beliefs?: No Depression: Yes Depression Symptoms: Feeling angry/irritable, Loss of interest in usual pleasures, Feeling worthless/self pity, Guilt, Fatigue, Isolating, Insomnia, Despondent, Tearfulness Substance abuse history and/or treatment for substance abuse?: No Suicide prevention information given to non-admitted patients: Not applicable  Risk to Others: Risk to Others within the past 6 months Homicidal Ideation: Yes-Currently Present Thoughts of Harm to Others: Yes-Currently Present Comment - Thoughts of Harm to Others:  ("I want to kill my fiance and fiance's mother for IVC'ing me) Current Homicidal Intent: Yes-Currently Present Current Homicidal Plan: No Access to Homicidal Means: No Identified Victim:  (n/a) History of harm to others?: No Assessment of Violence: None Noted Violent Behavior Description:  (patient is calm and cooperative ) Does patient have access to weapons?: No Criminal Charges Pending?: No Does patient have a court date: No  Abuse: Abuse/Neglect Assessment (Assessment to be complete while patient is  alone) Physical Abuse: Denies Verbal Abuse: Denies Sexual Abuse: Denies Exploitation of patient/patient's resources: Denies Self-Neglect: Denies  Prior Inpatient Therapy: Prior Inpatient Therapy Prior Inpatient Therapy: No Prior Therapy Dates:  (n/a) Prior Therapy Facilty/Provider(s):  (n/a) Reason for Treatment:  (n/a)  Prior Outpatient Therapy: Prior Outpatient Therapy Prior Outpatient Therapy: Yes Prior Therapy Dates:  (past psychiatrist - Dr. Rona Ravens ) Prior Therapy Facilty/Provider(s):  (Dr. Rona Ravens) Reason for Treatment: medication managment   Additional Information: Additional Information 1:1 In Past 12 Months?: No CIRT Risk: No Elopement Risk: No Does patient have medical clearance?: Yes   Objective: Blood pressure 108/65, pulse 89, temperature 98 F (36.7 C), temperature source Oral, resp. rate 17, SpO2 97 %.There is no weight on file to calculate BMI. Results for orders placed or performed during the hospital encounter of 01/30/14 (from the past 72 hour(s))  Acetaminophen level     Status: Abnormal   Collection Time: 01/30/14 11:38 PM  Result Value Ref Range   Acetaminophen (Tylenol), Serum <10.0 (L) 10 - 30 ug/mL    Comment:        THERAPEUTIC CONCENTRATIONS VARY SIGNIFICANTLY. A RANGE OF  10-30 ug/mL MAY BE AN EFFECTIVE CONCENTRATION FOR MANY PATIENTS. HOWEVER, SOME ARE BEST TREATED AT CONCENTRATIONS OUTSIDE THIS RANGE. ACETAMINOPHEN CONCENTRATIONS >150 ug/mL AT 4 HOURS AFTER INGESTION AND >50 ug/mL AT 12 HOURS AFTER INGESTION ARE OFTEN ASSOCIATED WITH TOXIC REACTIONS.   CBC     Status: Abnormal   Collection Time: 01/30/14 11:38 PM  Result Value Ref Range   WBC 13.2 (H) 4.0 - 10.5 K/uL   RBC 4.32 3.87 - 5.11 MIL/uL   Hemoglobin 12.8 12.0 - 15.0 g/dL   HCT 37.1 36.0 - 46.0 %   MCV 85.9 78.0 - 100.0 fL   MCH 29.6 26.0 - 34.0 pg   MCHC 34.5 30.0 - 36.0 g/dL   RDW 13.0 11.5 - 15.5 %   Platelets 232 150 - 400 K/uL  Comprehensive metabolic panel      Status: Abnormal   Collection Time: 01/30/14 11:38 PM  Result Value Ref Range   Sodium 134 (L) 135 - 145 mmol/L    Comment: Please note change in reference range.   Potassium 3.1 (L) 3.5 - 5.1 mmol/L    Comment: Please note change in reference range. DELTA CHECK NOTED    Chloride 99 96 - 112 mEq/L   CO2 25 19 - 32 mmol/L   Glucose, Bld 92 70 - 99 mg/dL   BUN 5 (L) 6 - 23 mg/dL   Creatinine, Ser 0.50 0.50 - 1.10 mg/dL   Calcium 9.3 8.4 - 10.5 mg/dL   Total Protein 7.4 6.0 - 8.3 g/dL   Albumin 4.1 3.5 - 5.2 g/dL   AST 27 0 - 37 U/L   ALT 26 0 - 35 U/L   Alkaline Phosphatase 155 (H) 39 - 117 U/L   Total Bilirubin 0.3 0.3 - 1.2 mg/dL   GFR calc non Af Amer >90 >90 mL/min   GFR calc Af Amer >90 >90 mL/min    Comment: (NOTE) The eGFR has been calculated using the CKD EPI equation. This calculation has not been validated in all clinical situations. eGFR's persistently <90 mL/min signify possible Chronic Kidney Disease.    Anion gap 10 5 - 15  Ethanol (ETOH)     Status: None   Collection Time: 01/30/14 11:38 PM  Result Value Ref Range   Alcohol, Ethyl (B) <5 0 - 9 mg/dL    Comment:        LOWEST DETECTABLE LIMIT FOR SERUM ALCOHOL IS 11 mg/dL FOR MEDICAL PURPOSES ONLY   Salicylate level     Status: None   Collection Time: 01/30/14 11:38 PM  Result Value Ref Range   Salicylate Lvl <4.7 2.8 - 20.0 mg/dL  Urine Drug Screen     Status: Abnormal   Collection Time: 01/31/14  8:09 AM  Result Value Ref Range   Opiates NONE DETECTED NONE DETECTED   Cocaine NONE DETECTED NONE DETECTED   Benzodiazepines POSITIVE (A) NONE DETECTED   Amphetamines NONE DETECTED NONE DETECTED   Tetrahydrocannabinol NONE DETECTED NONE DETECTED   Barbiturates POSITIVE (A) NONE DETECTED    Comment:        DRUG SCREEN FOR MEDICAL PURPOSES ONLY.  IF CONFIRMATION IS NEEDED FOR ANY PURPOSE, NOTIFY LAB WITHIN 5 DAYS.        LOWEST DETECTABLE LIMITS FOR URINE DRUG SCREEN Drug Class       Cutoff  (ng/mL) Amphetamine      1000 Barbiturate      200 Benzodiazepine   096 Tricyclics       283 Opiates  300 Cocaine          300 THC              50    Labs are reviewed and are pertinent for UDS positive for Benzos and Barbiturates.  Current Facility-Administered Medications  Medication Dose Route Frequency Provider Last Rate Last Dose  . acetaminophen (TYLENOL) tablet 650 mg  650 mg Oral Q4H PRN Antonietta Breach, PA-C      . alum & mag hydroxide-simeth (MAALOX/MYLANTA) 200-200-20 MG/5ML suspension 30 mL  30 mL Oral PRN Antonietta Breach, PA-C      . clonazePAM Bobbye Charleston) tablet 1 mg  1 mg Oral BID Ephraim Hamburger, MD   1 mg at 01/31/14 7116  . DULoxetine (CYMBALTA) DR capsule 30 mg  30 mg Oral Daily Ephraim Hamburger, MD   30 mg at 01/31/14 0919  . hydrOXYzine (ATARAX/VISTARIL) tablet 25 mg  25 mg Oral Q6H PRN Ephraim Hamburger, MD      . nicotine (NICODERM CQ - dosed in mg/24 hours) patch 21 mg  21 mg Transdermal Daily Antonietta Breach, PA-C   21 mg at 01/31/14 5790  . pregabalin (LYRICA) capsule 100 mg  100 mg Oral TID Ephraim Hamburger, MD   100 mg at 01/31/14 3833  . zolpidem (AMBIEN) tablet 10 mg  10 mg Oral QHS Ephraim Hamburger, MD      . zolpidem (AMBIEN) tablet 5 mg  5 mg Oral QHS PRN Antonietta Breach, PA-C       Current Outpatient Prescriptions  Medication Sig Dispense Refill  . buprenorphine (SUBUTEX) 8 MG SUBL SL tablet Place 8 mg under the tongue daily.    . Buprenorphine HCl-Naloxone HCl (ZUBSOLV) 5.7-1.4 MG SUBL Place 1 tablet under the tongue daily.    . butalbital-acetaminophen-caffeine (FIORICET) 50-325-40 MG per tablet Take 1 tablet by mouth every 8 (eight) hours as needed. headaches    . clonazePAM (KLONOPIN) 1 MG tablet Take 1 mg by mouth 2 (two) times daily.     Marland Kitchen LYRICA 100 MG capsule TAKE ONE CAPSULE BY MOUTH THREE TIMES DAILY 90 capsule 1  . promethazine (PHENERGAN) 25 MG tablet Take 25 mg by mouth every 6 (six) hours as needed for nausea or vomiting.    Marland Kitchen tiZANidine  (ZANAFLEX) 4 MG tablet Take 1 tablet (4 mg total) by mouth 3 (three) times daily. 90 tablet 2  . zolpidem (AMBIEN) 10 MG tablet Take 10 mg by mouth at bedtime.    . benzonatate (TESSALON) 100 MG capsule Take 1 capsule (100 mg total) by mouth 3 (three) times daily as needed for cough. (Patient not taking: Reported on 01/30/2014) 21 capsule 0  . DULoxetine (CYMBALTA) 30 MG capsule Take 1 capsule (30 mg total) by mouth daily. (Patient not taking: Reported on 01/30/2014) 90 capsule 0  . hydrOXYzine (ATARAX/VISTARIL) 25 MG tablet Take 1 tablet every 6 hours as need when feeling anxious (Patient not taking: Reported on 01/30/2014) 10 tablet 0  . lidocaine (LIDODERM) 5 % Place 1 patch onto the skin daily. Remove & Discard patch within 12 hours or as directed by MD 30 patch 0  . predniSONE (DELTASONE) 20 MG tablet 2 tabs po daily x 4 days (Patient not taking: Reported on 01/30/2014) 8 tablet 0    Psychiatric Specialty Exam:     Blood pressure 108/65, pulse 89, temperature 98 F (36.7 C), temperature source Oral, resp. rate 17, SpO2 97 %.There is no weight on file to calculate BMI.  General Appearance: Casual  Eye Contact::  Poor  Speech:  Clear and Coherent and Pressured  Volume:  Normal  Mood:  Angry, Anxious and Depressed  Affect:  Congruent and Depressed  Thought Process:  Coherent, Goal Directed and Intact  Orientation:  Full (Time, Place, and Person)  Thought Content:  WDL  Suicidal Thoughts:  No  Homicidal Thoughts:  No  Memory:  Immediate;   Good Recent;   Good Remote;   Good  Judgement:  Fair  Insight:  Fair  Psychomotor Activity:  Normal  Concentration:  Good  Recall:  NA  Fund of Knowledge:Fair  Language: Good  Akathisia:  NA  Handed:  Right  AIMS (if indicated):     Assets:  Desire for Improvement  Sleep:      Musculoskeletal: Strength & Muscle Tone: was seen in bed  Gait & Station: was sitting in bed during the assessment.  C/O ANKLE PAIN Patient leans: Was seen in  bed.  Treatment Plan Summary: Discharge home to follow up with pain clinic provider and a new Psychiatrist.  [name] [name]   PMHNP-BC 01/31/2014 2:38 PM  Patient seen, evaluated and I agree with notes by Nurse Practitioner. Corena Pilgrim, MD

## 2014-01-31 NOTE — Discharge Instructions (Signed)
For your ongoing mental health needs, as well as Suboxone treatment, you have been scheduled for an initial appointment at [facility] on Monday, 02/11/2014 at 11:30.  To keep this appointment you will need to call the office BEFORE 6:00 PM TODAY.  There is an up front administrative fee of $350 associated with this appointment:       [facility]      [address]      [place], [place] [address]      [phone]

## 2014-01-31 NOTE — [facility] Suicide Risk Assessment (Cosign Needed)
Suicide Risk Assessment  Discharge Assessment     Demographic Factors:  Divorced or widowed, Caucasian and Unemployed  Total Time spent with patient: 45 minutes  Psychiatric Specialty Exam:     Blood pressure 108/65, pulse 89, temperature 98 F (36.7 C), temperature source Oral, resp. rate 17, SpO2 97 %.There is no weight on file to calculate BMI.  General Appearance: Casual  Eye Contact::  Poor  Speech:  Clear and Coherent and Pressured  Volume:  Normal  Mood:  Angry, Anxious and Depressed  Affect:  Congruent and Depressed  Thought Process:  Coherent, Goal Directed and Intact  Orientation:  Full (Time, Place, and Person)  Thought Content:  WDL  Suicidal Thoughts:  No  Homicidal Thoughts:  No  Memory:  Immediate;   Good Recent;   Good Remote;   Good  Judgement:  Fair  Insight:  Fair  Psychomotor Activity:  Normal  Concentration:  Good  Recall:  NA  Fund of Knowledge:Fair  Language: Good  Akathisia:  NA  Handed:  Right  AIMS (if indicated):     Assets:  Desire for Improvement  Sleep:       Musculoskeletal: Strength & Muscle Tone: seen sitting in bed Gait & Station: seen sitting in bed, c/o Chronic pain Patient leans: seen sitting in bed   Mental Status Per Nursing Assessment::   On Admission:     Current Mental Status by Physician: NA  Loss Factors: NA and c/o chronic pain  Historical Factors: NA  Risk Reduction Factors:   Living with another person, especially a relative and Positive therapeutic relationship  Continued Clinical Symptoms:  Depression:   Hopelessness  Cognitive Features That Contribute To Risk:  Polarized thinking    Suicide Risk:  Minimal: No identifiable suicidal ideation.  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      Past Medical History  Diagnosis Date  . Anxiety   . Thyroid disease   . Substance abuse   . Pneumonia   . Bronchitis   . Kidney stone   . Endometriosis   . Chronic pain   . GERD (gastroesophageal reflux disease)   . Gastritis, chronic   . PONV (postoperative nausea and vomiting)   . Insomnia     Plan Of Care/Follow-up recommendations:  Follow up with pain clinic on Monday Follow up with referred Psychiatrist as soon as possible. Activity:  as tolerated Diet:  Regular  Is patient on multiple antipsychotic therapies at discharge:  No   Has Patient had three or more failed trials of antipsychotic monotherapy by history:  No  Recommended Plan for Multiple Antipsychotic Therapies: NA  Dahlia ByesONUOHA, Lowell Makara, C   PMHNP-BC 01/31/2014, 3:16 PM

## 2014-01-31 NOTE — ED Provider Notes (Signed)
CSN: [id number]     Arrival date & time 01/30/14  2257 History   First MD Initiated Contact with Patient 01/31/14 0100     Chief Complaint  Patient presents with  . Suicidal    (Consider location/radiation/quality/duration/timing/severity/associated sxs/prior Treatment) HPI Comments: Patient is a 48 year old female who presents to the emergency department for further psychiatric evaluation. Per triage note, patient presented for complaints of depression with thoughts of suicide. Patient also reported taking 20 Fioricet tablets over the past 3 days for headache. Patient has a history of chronic regional pain syndrome and she states that her depression is secondary to this diagnosis as most of her pain is usually in her right ankle and she was always very active. She states that she can no longer be active secondary to her regional pain syndrome. She states that her ex-fianc, Loraine Leriche, called EMS. Patient on my encounter denies any thoughts of suicide. No homicidal ideations. Patient denies alcohol and illicit drug use. Per nursing patient was heard quoting, "I don't like being here, but I'm depressed and can't live like this".  The history is provided by the patient. No language interpreter was used.    Past Medical History  Diagnosis Date  . Anxiety   . Thyroid disease   . Substance abuse   . Pneumonia   . Bronchitis   . Kidney stone   . Endometriosis   . Chronic pain   . GERD (gastroesophageal reflux disease)   . Gastritis, chronic   . PONV (postoperative nausea and vomiting)   . Insomnia    Past Surgical History  Procedure Laterality Date  . Abdominal hysterectomy    . Cholecystectomy    . Appendectomy    . Ex laporotomy      x2-adhesion  . Knee arthroplasty      left knee age 44  . Finger arthrodesis      left ring  . Ankle reconstruction Right 08/24/2013    Procedure: RECONSTRUCTION ANKLE;  Surgeon: Harvie Junior, MD;  Location: [facility];  Service:  Orthopedics;  Laterality: Right;   Family History  Problem Relation Age of Onset  . Thyroid disease Mother   . CAD Mother   . Thyroid disease Sister   . CAD Other    History  Substance Use Topics  . Smoking status: Current Every Day Smoker -- 0.50 packs/day  . Smokeless tobacco: Never Used  . Alcohol Use: No     Comment: socially-no now   OB History    No data available      Review of Systems  Psychiatric/Behavioral: Positive for suicidal ideas and behavioral problems.  All other systems reviewed and are negative.   Allergies  Sulfa antibiotics; Reglan; Clindamycin/lincomycin; Compazine; Latex; Morphine and related; Penicillins; and Zofran  Home Medications   Prior to Admission medications   Medication Sig Start Date End Date Taking? Authorizing Provider  buprenorphine (SUBUTEX) 8 MG SUBL SL tablet Place 8 mg under the tongue daily.   Yes Historical Provider, MD  butalbital-acetaminophen-caffeine (FIORICET) 50-325-40 MG per tablet Take 1 tablet by mouth every 8 (eight) hours as needed. headaches 01/22/14  Yes Historical Provider, MD  clonazePAM (KLONOPIN) 1 MG tablet Take 1 mg by mouth 2 (two) times daily.    Yes Historical Provider, MD  LYRICA 100 MG capsule TAKE ONE CAPSULE BY MOUTH THREE TIMES DAILY 01/10/14  Yes Judi Saa, DO  promethazine (PHENERGAN) 25 MG tablet Take 25 mg by mouth every 6 (  six) hours as needed for nausea or vomiting.   Yes Historical Provider, MD  tiZANidine (ZANAFLEX) 4 MG tablet Take 1 tablet (4 mg total) by mouth 3 (three) times daily. 12/12/13  Yes Judi SaaZachary M Smith, DO  zolpidem (AMBIEN) 10 MG tablet Take 10 mg by mouth at bedtime. 01/03/14 02/02/14 Yes Historical Provider, MD  benzonatate (TESSALON) 100 MG capsule Take 1 capsule (100 mg total) by mouth 3 (three) times daily as needed for cough. Patient not taking: Reported on 01/30/2014 12/31/13   Fayrene HelperBowie Tran, PA-C  DULoxetine (CYMBALTA) 30 MG capsule Take 1 capsule (30 mg total) by mouth  daily. Patient not taking: Reported on 01/30/2014 12/12/13   Judi SaaZachary M Smith, DO  hydrOXYzine (ATARAX/VISTARIL) 25 MG tablet Take 1 tablet every 6 hours as need when feeling anxious Patient not taking: Reported on 01/30/2014 12/31/13   Fayrene HelperBowie Tran, PA-C  lidocaine (LIDODERM) 5 % Place 1 patch onto the skin daily. Remove & Discard patch within 12 hours or as directed by MD 11/23/13   Judi SaaZachary M Smith, DO  predniSONE (DELTASONE) 20 MG tablet 2 tabs po daily x 4 days Patient not taking: Reported on 01/30/2014 12/31/13   Fayrene HelperBowie Tran, PA-C   BP 110/67 mmHg  Pulse 84  Temp(Src) 97.7 F (36.5 C) (Oral)  Resp 16  SpO2 98%   Physical Exam  Constitutional: She is oriented to person, place, and time. She appears well-developed and well-nourished. No distress.  Patient very sleepy; difficult to converse with as she keeps falling back to sleep.  HENT:  Head: Normocephalic and atraumatic.  Eyes: Conjunctivae and EOM are normal. No scleral icterus.  Neck: Normal range of motion.  Pulmonary/Chest: Effort normal. No respiratory distress.  Chest expansion symmetric.  Musculoskeletal: Normal range of motion.  Neurological: She is alert and oriented to person, place, and time. No cranial nerve deficit. She exhibits normal muscle tone. Coordination normal.  GCS 15. Speech is oriented. No focal neurologic deficits appreciated. Patient moves extremities without ataxia.  Skin: Skin is warm and dry. No rash noted. She is not diaphoretic. No erythema. No pallor.  Psychiatric: Her speech is slurred. She is slowed. She exhibits a depressed mood. She expresses no homicidal and no suicidal ideation. She expresses no suicidal plans and no homicidal plans.  Nursing note and vitals reviewed.   ED Course  Procedures (including critical care time) Labs Review Labs Reviewed  ACETAMINOPHEN LEVEL - Abnormal; Notable for the following:    Acetaminophen (Tylenol), Serum <10.0 (*)    All other components within normal  limits  CBC - Abnormal; Notable for the following:    WBC 13.2 (*)    All other components within normal limits  COMPREHENSIVE METABOLIC PANEL - Abnormal; Notable for the following:    Sodium 134 (*)    Potassium 3.1 (*)    BUN 5 (*)    Alkaline Phosphatase 155 (*)    All other components within normal limits  ETHANOL  SALICYLATE LEVEL  URINE RAPID DRUG SCREEN (HOSP PERFORMED)    Imaging Review No results found.   EKG Interpretation None      MDM   Final diagnoses:  Depression    48 year old female presents to the emergency department for further evaluation of worsening depression. Patient states that her depression is secondary to worsening chronic regional pain syndrome. Patient allegedly endorsing suicidal thoughts. She denies this during my encounter on 3 separate occasions. She does admit to worsening depression. She denies homicidal thoughts as well  as illicit drug use.   Patient allegedly took 4920 Fioricet tablets over the past 36 hours. Her laboratory workup is unremarkable. Poison control recommended liver function testing which is normal today. Anion gap is also normal. Patient has been medically cleared. She is currently pending TTS evaluation. TTS to advise on further psychiatric management.   Filed Vitals:   01/30/14 2300  BP: 110/67  Pulse: 84  Temp: 97.7 F (36.5 C)  TempSrc: Oral  Resp: 16  SpO2: 98%       Antony MaduraKelly Tigran Haynie, PA-C 01/31/14 [id number]  Gerhard Munchobert Lockwood, MD 01/31/14 [phone]

## 2014-01-31 NOTE — ED Notes (Addendum)
Poison control recommends drawing liver function levels reference taking 20 fioricets.

## 2014-01-31 NOTE — ED Notes (Signed)
Pt. and belongings wanded by security 

## 2014-01-31 NOTE — BH Assessment (Addendum)
Assessment Note  [name] [name] is an 48 y.o. female. Pt presents from home via EMS with c/o depression and thoughts of suicide. Per ED notes, patient also reports she has taken 20 fioricets in the past 36 hours, last one was 9:30pm last night because she has bad cluster headaches and has been having them worse than usual. Pt denies any relation to taking the medication to her SI.   Writer met with patient to complete the TTS consult. Patient under the impression that she is here @ [facility] under IVC by her fiance and fiance's mother. Sts that she lives with them both. Patient reports on-going issues with chronic pain related to surgery's and medical issues. Sts that she had ankle surgery several months ago. Patient was dx's with "Complex Regional Syndrome" after the surgery. She was also told that she wouldn't be able to walk efficiently. Patient has since been limited with walking, using a boot, and crutches. Patient sts that dealing with her recent surgery and chronic pain issues, "Makes me not want to be here anymore". Depressive symptoms include hopelessness, fatigue, and loss of interest in usual pleasures. Appetite is poor with 10 pounds of weight loss in the past several months. Sleep is poor.  Patient admits to having suicidal thoughts with a plan to overdose. She reports 1 prior suicide attempt yrs ago due to chronic pain. She feels homicidal toward fiance and fiance's mother stating they are the reason she is here at [facility]. Patient denies AVH's. No alcohol use reported. Patient reports that she is currently prescribed suboxone and last took it yesterday. She is also prescribed Flexeril in which she admits to taking 20 pills in the past 36 hours due to a cluster headache. Patient denies that this was a suicide attempt. Patient does not have a current psychiatrist but was under the care of (psychiatrist) Dr. Carloyn Manner Book in the past. No hx of inpatient hospitalizations.  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 Anxiety   . Thyroid disease   . Substance abuse   . Pneumonia   . Bronchitis   . Kidney stone   . Endometriosis   . Chronic pain   . GERD (gastroesophageal reflux disease)   . Gastritis, chronic   . PONV (postoperative nausea and vomiting)   . Insomnia    Axis IV: other psychosocial or environmental problems, problems related to social environment, problems with access to health care services and problems with primary support group Axis V: 31-40 impairment in reality testing  Past Medical History:  Past Medical History  Diagnosis Date  . Anxiety   . Thyroid disease   . Substance abuse   . Pneumonia   . Bronchitis   . Kidney stone   . Endometriosis   . Chronic pain   . GERD (gastroesophageal reflux disease)   . Gastritis, chronic   . PONV (postoperative nausea and vomiting)   . Insomnia     Past Surgical History  Procedure Laterality Date  . Abdominal hysterectomy    . Cholecystectomy    . Appendectomy    . Ex laporotomy      x2-adhesion  . Knee arthroplasty      left knee age 40  . Finger arthrodesis      left ring  . Ankle reconstruction Right 08/24/2013    Procedure: RECONSTRUCTION ANKLE;  Surgeon: Alta Corning, MD;  Location: [facility];  Service: Orthopedics;  Laterality: Right;    Family History:  Family History  Problem Relation Age of Onset  . Thyroid disease Mother   . CAD Mother   . Thyroid disease Sister   . CAD Other     Social History:  reports that she has been smoking.  She has never used smokeless tobacco. She reports that she uses illicit drugs (Opium). She reports that she does not drink alcohol.  Additional Social History:  Alcohol / Drug Use Pain Medications: SEE MAR Prescriptions: SEE MAR Over the Counter: SEE MAR History of alcohol / drug use?: Yes Negative Consequences of Use: Personal  relationships Substance #1 Name of Substance 1: Suboxone  1 - Age of First Use: "I started April 2015" 1 - Amount (size/oz): 23m's 3x's per day  1 - Frequency: daily since April 2015 1 - Duration: on-going  1 - Last Use / Amount: 01/30/2014 Substance #2 Name of Substance 2: Opiates (Oxycodone) 2 - Age of First Use: Patient started use in 2004 after gall bladder surgery  2 - Amount (size/oz): 845ms  2 - Frequency: 2004-April 2015 2 - Duration: on-going  2 - Last Use / Amount: April 2015 Substance #3 Name of Substance 3: Klonopin  3 - Age of First Use: Patient unable to recall age of first use 3 - Amount (size/oz): 49m68m 3 - Frequency: daily  3 - Duration: "on and off for yrs" 3 - Last Use / Amount: 01/31/2014  CIWA: CIWA-Ar BP: 115/63 mmHg Pulse Rate: 81 COWS:    Allergies:  Allergies  Allergen Reactions  . Sulfa Antibiotics Anaphylaxis  . Reglan [Metoclopramide] Nausea And Vomiting and Other (See Comments)    hallucinations   . Clindamycin/Lincomycin Nausea Only and Other (See Comments)    GI upset  . Compazine [Prochlorperazine Edisylate] Other (See Comments)    Restless leg  . Latex Hives  . Morphine And Related Rash    Can have with benadryl  . Penicillins Hives, Nausea Only and Rash  . Zofran [Ondansetron Hcl] Hives and Nausea And Vomiting    Can have benadryl with it     Home Medications:  (Not in a hospital admission)  OB/GYN Status:  No LMP recorded. Patient has had a hysterectomy.  General Assessment Data Location of Assessment: [facility] Is this a Tele or Face-to-Face Assessment?: Face-to-Face Is this an Initial Assessment or a Re-assessment for this encounter?: Initial Assessment Living Arrangements: Spouse/significant other Can pt return to current living arrangement?: Yes Admission Status: Voluntary Is patient capable of signing voluntary admission?: Yes [facility]     [facility]: Spouse/significant other Name of Psychiatrist:  (Psychiatrist in the past-Dr. RoyRona RavensName of Therapist:  (None reported )     Risk to self with the past 6 months Suicidal Ideation: Yes-Currently Present Suicidal Intent: Yes-Currently Present Is patient at risk for suicide?: Yes Suicidal Plan?: Yes-Currently Present Specify Current Suicidal Plan:  (OD) Access to Means: Yes Specify Access to Suicidal Means:  (patient has access to pills ) What has been your use of drugs/alcohol within the last 12 months?:  (patient reports taking RX suboxone (hx of opiate abuse)) Previous Attempts/Gestures: Yes How many times?:  (1 prior OD yrs ago ) Other Self Harm Risks: none reported  Triggers for Past Attempts: Other (Comment) (chronic pain issues ) Intentional Self Injurious Behavior: None Family Suicide History: Yes ("My great grandmother  was crazy") Recent stressful life event(s): Other (Comment) (on-going chronic pain, medical, limited walking ) Persecutory voices/beliefs?: No Depression: Yes Depression Symptoms: Feeling angry/irritable, Loss of interest in usual pleasures, Feeling worthless/self pity, Guilt, Fatigue, Isolating, Insomnia, Despondent, Tearfulness Substance abuse history and/or treatment for substance abuse?: No Suicide prevention information given to non-admitted patients: Not applicable  Risk to Others within the past 6 months Homicidal Ideation: Yes-Currently Present Thoughts of Harm to Others: Yes-Currently Present Comment - Thoughts of Harm to Others:  ("I want to kill my fiance and fiance's mother for IVC'ing me) Current Homicidal Intent: Yes-Currently Present Current Homicidal Plan: No Access to Homicidal Means: No Identified Victim:  (n/a) History of harm to others?: No Assessment of Violence: None Noted Violent Behavior Description:  (patient is calm and cooperative ) Does patient have access to weapons?: No Criminal Charges  Pending?: No Does patient have a court date: No  Psychosis Hallucinations: None noted Delusions: None noted  Mental Status Report Appear/Hygiene: Disheveled Eye Contact: Good Motor Activity: Freedom of movement Speech: Logical/coherent Level of Consciousness: Alert Mood: Depressed Affect: Appropriate to circumstance Anxiety Level: None Thought Processes: Relevant, Coherent Judgement: Impaired Orientation: Person, Place, Situation, Time Obsessive Compulsive Thoughts/Behaviors: None  Cognitive Functioning Concentration: Decreased Memory: Recent Intact, Remote Intact IQ: Average Insight: Poor Impulse Control: Poor Appetite: Poor Weight Loss:  (10 pounds since April 2015) Weight Gain:  (none reported ) Sleep: No Change Total Hours of Sleep:  (n/a) Vegetative Symptoms: None  ADLScreening [facility] Assessment Services) Patient's cognitive ability adequate to safely complete daily activities?: Yes Patient able to express need for assistance with ADLs?: Yes Independently performs ADLs?: Yes (appropriate for developmental age) (Pt was wearing a boot and crutches on foot up until 12/2013 )  Prior Inpatient Therapy Prior Inpatient Therapy: No Prior Therapy Dates:  (n/a) Prior Therapy Facilty/Provider(s):  (n/a) Reason for Treatment:  (n/a)  Prior Outpatient Therapy Prior Outpatient Therapy: Yes Prior Therapy Dates:  (past psychiatrist - Dr. Rona Ravens ) Prior Therapy Facilty/Provider(s):  (Dr. Rona Ravens) Reason for Treatment: medication managment   ADL Screening (condition at time of admission) Patient's cognitive ability adequate to safely complete daily activities?: Yes Is the patient deaf or have difficulty hearing?: No Does the patient have difficulty seeing, even when wearing glasses/contacts?: No Does the patient have difficulty concentrating, remembering, or making decisions?: Yes Patient able to express need for assistance with ADLs?: Yes Does the patient have difficulty  dressing or bathing?: No Independently performs ADLs?: Yes (appropriate for developmental age) (Pt was wearing a boot and crutches on foot up until 12/2013 ) Communication: Independent Dressing (OT): Independent Grooming: Independent Feeding: Independent Bathing: Independent Toileting: Independent In/Out Bed: Independent Walks in Home: Independent Does the patient have difficulty walking or climbing stairs?: No Weakness of Legs: None  Home Assistive Devices/Equipment Home Assistive Devices/Equipment: None    Abuse/Neglect Assessment (Assessment to be complete while patient is alone) Physical Abuse: Denies Verbal Abuse: Denies Sexual Abuse: Denies Exploitation of patient/patient's resources: Denies Self-Neglect: Denies Values / Beliefs Cultural Requests During Hospitalization: None Spiritual Requests During Hospitalization: None   Advance Directives (For Healthcare) Does patient have an advance directive?: No    Additional Information 1:1 In Past 12 Months?: No CIRT Risk: No Elopement Risk: No Does patient have medical clearance?: Yes     Disposition:  Disposition Initial Assessment Completed for this Encounter: Yes Disposition of Patient: Other dispositions (Disposition pending psychiatric evaluation by Dr. Darleene Cleaver)  On Site Evaluation by:   Reviewed with Physician:  Waldon Merl [facility] 01/31/2014 9:12 AM

## 2014-02-04 ENCOUNTER — Telehealth: Payer: Self-pay | Admitting: Family Medicine

## 2014-02-04 NOTE — Telephone Encounter (Signed)
Patient states she is having more pain in her ankle.  Patient states she left vm earlier.  She is requesting call back in regards.

## 2014-02-04 NOTE — Telephone Encounter (Signed)
Spoke to pt, scheduled appt for 1.6.15.

## 2014-02-06 ENCOUNTER — Encounter: Payer: Self-pay | Admitting: Family Medicine

## 2014-02-06 ENCOUNTER — Ambulatory Visit (INDEPENDENT_AMBULATORY_CARE_PROVIDER_SITE_OTHER): Payer: 59 | Admitting: Family Medicine

## 2014-02-06 ENCOUNTER — Other Ambulatory Visit: Payer: Self-pay | Admitting: Family Medicine

## 2014-02-06 VITALS — BP 122/80 | HR 98

## 2014-02-06 DIAGNOSIS — G90521 Complex regional pain syndrome I of right lower limb: Secondary | ICD-10-CM

## 2014-02-06 DIAGNOSIS — G9050 Complex regional pain syndrome I, unspecified: Secondary | ICD-10-CM

## 2014-02-06 MED ORDER — BACLOFEN 10 MG PO TABS
10.0000 mg | ORAL_TABLET | Freq: Three times a day (TID) | ORAL | Status: AC
Start: 2014-02-06 — End: ?

## 2014-02-06 NOTE — Progress Notes (Signed)
[facility] [address] [place], [place] [address] Phone: [phone] Subjective:    CC: Right ankle pain follow up  [id number] [name] [name] is a 49 y.o. female coming in with complaint of right ankle pain. Patient does have a past medical history significant for her opiate dependence.  Please read past history for further information. Patient is here again after the MRI showing a chronic regional pain syndrome like pattern.  Patient was seen previously and was started on Cymbalta 20 mg daily as well as Lyrica. We had to titrate up on the Lyrica 200 mg 3 times a day. Patient did increase cymbalta to 30 mg daily after last visit.  Patient has had a recent flare of pain. Patient also had exacerbation of underlying psychiatric problems. Patient has stopped the Cymbalta because she felt like it was not helping. Patient is decrease in the bilateral occasions. Patient feels she was doing better on chronic narcotics would like referral to a pain clinic. Patient's pain is so severe that she feels that she is disabled. Patient is unable to walk long distances secondary to the pain. Patient is back walking in a [employer] on a regular basis. States that if she is on her feet for any length of time she has significant swelling.  Past History:  Patient has had right ankle pain for quite some time and unfortunately had a tear of the anterior tibiofibular ligament and had continued instability of the ankle. Patient was not improving with conservative therapy including formal physical therapy in 2 corticosteroid injections. Patient actually did have ankle reconstruction by Dr. Luiz Blare in July. Patient continued to have a significant amount of pain even a month after the surgery. Patient was put in a cast. Patient did wear the cast for quite some time but no significant improvement. On x-rays back in September 23, 2013 patient did have x-rays. X-rays showed no bony abnormality. Patient  was seen previously and did have an MRI showing signs that are consistent with a complex regional pain syndrome.      Past medical history, social, surgical and family history all reviewed in electronic medical record.   Review of Systems: No headache, visual changes, nausea, vomiting, diarrhea, constipation, dizziness, abdominal pain, skin rash, fevers, chills, night sweats, weight loss, swollen lymph nodes, body aches, joint swelling, muscle aches, chest pain, shortness of breath, mood changes.   Objective Blood pressure 122/80, pulse 98, SpO2 99 %.  General: No apparent distress alert and oriented x3 mood and affect normal, dressed appropriately.  HEENT: Pupils equal, extraocular movements intact  Respiratory: Patient's speak in full sentences and does not appear short of breath  Cardiovascular: No lower extremity edema, non tender, no erythema  Skin: Warm dry intact with no signs of infection or rash on extremities or on axial skeleton.  Abdomen: Soft nontender  Neuro: Cranial nerves II through XII are intact, neurovascularly intact in all extremities with 2+ DTRs and 2+ pulses.  Lymph: No lymphadenopathy of posterior or anterior cervical chain or axillae bilaterally.  Gait normal with good balance and coordination.  MSK:  Non tender with full range of motion and good stability and symmetric strength and tone of shoulders, elbows, wrist, hip, knee and bilaterally.  Ankle: Right No effusion noted no swelling Patient does have limited range of motion actively and passively moderately improved still lacking the last 7 of dorsiflexion and the last 7 of plantarflexion. Continued improvement compared to previous exam  Patient can  evert the foot fine but has pain with eversion.  Contralateral ankle unremarkable    Impression and Recommendations:     This case required medical decision making of moderate complexity.

## 2014-02-06 NOTE — Assessment & Plan Note (Signed)
Patient is a difficult case. Patient is having a exacerbation secondary to the comfort regional pain syndrome. Due to patient's other comorbidities including history of depression patient had difficulty possibly with the Cymbalta. Patient was doing better at the lower dose and we suggested going back to the 20 mg but patient declined. Patient feels that pain medications could be the most helpful. Discussed that this synthetic block could be a good treatment option. Patient would like to discuss this with someone at the pain clinic. Patient feels that she is still having muscle spasms and we will change her to baclofen to see if we get any better results. Patient did ask about Soma which I declined to fill. We discussed backing off on the Lyrica but patient feels that this is helpful at the 100 mg. Discussed with her at this time that I feel like she may benefit better from a subspecialist dealing with this and patient has been referred. Patient knows that I'm here if she has any questions but will only follow up with me as needed.  Spent greater than 25 minutes with patient face-to-face and had greater than 50% of counseling including as described above in assessment and plan.

## 2014-02-06 NOTE — Patient Instructions (Addendum)
Baclofen we will try Will send you to the pain clinic.  I am so sorry.  I think the sympathetic blocks would be helpful Try to move the ankle as much as you can.  Try the pennsaid twice daily and see if it helps.

## 2014-02-08 ENCOUNTER — Encounter: Payer: Self-pay | Admitting: Family Medicine

## 2014-02-08 ENCOUNTER — Other Ambulatory Visit: Payer: Self-pay | Admitting: *Deleted

## 2014-02-08 MED ORDER — CYCLOBENZAPRINE HCL 10 MG PO TABS: 10.0000 mg | ORAL_TABLET | Freq: Three times a day (TID) | ORAL | Status: AC | PRN

## 2014-02-08 NOTE — Telephone Encounter (Signed)
Per dr Katrinka Blazingsmith, rx for flexeril sent into pharmacy.

## 2014-03-28 ENCOUNTER — Encounter (HOSPITAL_COMMUNITY): Payer: Self-pay | Admitting: *Deleted

## 2014-03-28 ENCOUNTER — Emergency Department (HOSPITAL_COMMUNITY)
Admission: EM | Admit: 2014-03-28 | Discharge: 2014-03-29 | Disposition: A | Discharge status: Home / Self Care | Payer: 59 | Attending: Emergency Medicine | Admitting: Emergency Medicine

## 2014-03-28 DIAGNOSIS — G4700 Insomnia, unspecified: Secondary | ICD-10-CM | POA: Diagnosis not present

## 2014-03-28 DIAGNOSIS — Z79899 Other long term (current) drug therapy: Secondary | ICD-10-CM | POA: Insufficient documentation

## 2014-03-28 DIAGNOSIS — Z8719 Personal history of other diseases of the digestive system: Secondary | ICD-10-CM | POA: Diagnosis not present

## 2014-03-28 DIAGNOSIS — M25571 Pain in right ankle and joints of right foot: Secondary | ICD-10-CM | POA: Insufficient documentation

## 2014-03-28 DIAGNOSIS — Z87442 Personal history of urinary calculi: Secondary | ICD-10-CM | POA: Insufficient documentation

## 2014-03-28 DIAGNOSIS — Z720 Tobacco use: Secondary | ICD-10-CM | POA: Diagnosis not present

## 2014-03-28 DIAGNOSIS — G8929 Other chronic pain: Secondary | ICD-10-CM

## 2014-03-28 DIAGNOSIS — Z880 Allergy status to penicillin: Secondary | ICD-10-CM | POA: Insufficient documentation

## 2014-03-28 DIAGNOSIS — F419 Anxiety disorder, unspecified: Secondary | ICD-10-CM | POA: Insufficient documentation

## 2014-03-28 DIAGNOSIS — Z8639 Personal history of other endocrine, nutritional and metabolic disease: Secondary | ICD-10-CM | POA: Diagnosis not present

## 2014-03-28 DIAGNOSIS — G9050 Complex regional pain syndrome I, unspecified: Secondary | ICD-10-CM | POA: Diagnosis not present

## 2014-03-28 DIAGNOSIS — M79606 Pain in leg, unspecified: Secondary | ICD-10-CM | POA: Diagnosis present

## 2014-03-28 DIAGNOSIS — M25572 Pain in left ankle and joints of left foot: Secondary | ICD-10-CM | POA: Diagnosis not present

## 2014-03-28 DIAGNOSIS — Z91040 Latex allergy status: Secondary | ICD-10-CM | POA: Insufficient documentation

## 2014-03-28 DIAGNOSIS — Z8709 Personal history of other diseases of the respiratory system: Secondary | ICD-10-CM | POA: Diagnosis not present

## 2014-03-28 DIAGNOSIS — Z8701 Personal history of pneumonia (recurrent): Secondary | ICD-10-CM | POA: Diagnosis not present

## 2014-03-28 DIAGNOSIS — Z8742 Personal history of other diseases of the female genital tract: Secondary | ICD-10-CM | POA: Insufficient documentation

## 2014-03-28 HISTORY — DX: Reserved for concepts with insufficient information to code with codable children: IMO0002

## 2014-03-28 NOTE — ED Notes (Signed)
Pt lives in constant pain d/t per pt Chronic Regional Pain Syndrome.  However, 2 weeks ago the pain began increasing and now pain is unbearable.  [facility] is [facility] March 14[facility], but pt cannot wait.

## 2014-03-29 MED ORDER — HYDROMORPHONE HCL 1 MG/ML IJ SOLN: 1.0000 mg | Freq: Once | INTRAMUSCULAR | Status: DC

## 2014-03-29 MED ORDER — HYDROMORPHONE HCL 1 MG/ML IJ SOLN
1.0000 mg | Freq: Once | INTRAMUSCULAR | Status: AC
Start: 2014-03-29 — End: 2014-03-29
  Administered 2014-03-29: 1 mg via INTRAMUSCULAR

## 2014-03-29 MED ORDER — HYDROMORPHONE HCL 1 MG/ML IJ SOLN
1.0000 mg | Freq: Once | INTRAMUSCULAR | Status: DC
  Filled 2014-03-29: qty 1

## 2014-03-29 NOTE — ED Provider Notes (Signed)
CSN: [id number]     Arrival date & time 03/28/14  1745 History   First MD Initiated Contact with Patient 03/28/14 2338     Chief Complaint  Patient presents with  . Leg Pain     (Consider location/radiation/quality/duration/timing/severity/associated sxs/prior Treatment) HPI [name] [name] is a 49 y.o. female comes in for evaluation of a chronic pain exacerbation related to complex regional pain syndrome. Reports that most recent pain flare has evolved over the past 2 weeks. She is typically able to control his pain with Percocet at home, but not this time. She currently rates her discomfort as a 8/10, sharp pain. Walking exacerbates this discomfort. She denies numbness or weakness. No new skin changes, leg swelling. She has an appointment with the [facility] on March 14, but was unable to wait that long due to her current pain.  Past Medical History  Diagnosis Date  . Anxiety   . Thyroid disease   . Substance abuse   . Pneumonia   . Bronchitis   . Kidney stone   . Endometriosis   . Chronic pain   . GERD (gastroesophageal reflux disease)   . Gastritis, chronic   . PONV (postoperative nausea and vomiting)   . Insomnia   . Complex regional pain syndrome    Past Surgical History  Procedure Laterality Date  . Abdominal hysterectomy    . Cholecystectomy    . Appendectomy    . Ex laporotomy      x2-adhesion  . Knee arthroplasty      left knee age 75  . Finger arthrodesis      left ring  . Ankle reconstruction Right 08/24/2013    Procedure: RECONSTRUCTION ANKLE;  Surgeon: Harvie Junior, MD;  Location: [facility];  Service: Orthopedics;  Laterality: Right;   Family History  Problem Relation Age of Onset  . Thyroid disease Mother   . CAD Mother   . Thyroid disease Sister   . CAD Other    History  Substance Use Topics  . Smoking status: Current Every Day Smoker -- 0.50 packs/day  . Smokeless tobacco: Never Used  . Alcohol Use: No     Comment:  socially-no now   OB History    No data available     Review of Systems  All other systems reviewed and are negative.  A 10 point review of systems was completed and was negative except for pertinent positives and negatives as mentioned in the history of present illness     Allergies  Sulfa antibiotics; Reglan; Clindamycin/lincomycin; Compazine; Latex; Morphine and related; Penicillins; and Zofran  Home Medications   Prior to Admission medications   Medication Sig Start Date End Date Taking? Authorizing Provider  baclofen (LIORESAL) 10 MG tablet Take 1 tablet (10 mg total) by mouth 3 (three) times daily. 02/06/14  Yes Judi Saa, DO  butalbital-acetaminophen-caffeine (FIORICET) [phone] MG per tablet Take 1 tablet by mouth every 8 (eight) hours as needed. headaches 01/22/14  Yes Historical Provider, MD  clonazePAM (KLONOPIN) 1 MG tablet TAKE ONE TABLET BY MOUTH TWICE DAILY AS NEEDED FOR ANXIETY 01/31/14  Yes Historical Provider, MD  cyclobenzaprine (FLEXERIL) 10 MG tablet Take 1 tablet (10 mg total) by mouth 3 (three) times daily as needed for muscle spasms. 02/08/14  Yes Judi Saa, DO  LYRICA 100 MG capsule TAKE ONE CAPSULE BY MOUTH THREE TIMES DAILY 01/10/14  Yes Judi Saa, DO  zolpidem (AMBIEN) 10 MG tablet TAKE ONE TABLET  BY MOUTH AT BEDTIME AS NEEDED 01/31/14  Yes Historical Provider, MD  Buprenorphine HCl-Naloxone HCl (ZUBSOLV) 5.7-1.4 MG SUBL Place 1 tablet under the tongue daily.    Historical Provider, MD  lidocaine (LIDODERM) 5 % Place 1 patch onto the skin daily. Remove & Discard patch within 12 hours or as directed by MD Patient not taking: Reported on 03/28/2014 11/23/13   Judi SaaZachary M Smith, DO   BP 133/89 mmHg  Pulse 96  Temp(Src) 98.4 F (36.9 C) (Oral)  Resp 18  Ht 5\' 4"  (1.626 m)  Wt 143 lb (64.864 kg)  BMI 24.53 kg/m2  SpO2 100% Physical Exam  Constitutional: She is oriented to person, place, and time. She appears well-developed and  well-nourished.  HENT:  Head: Normocephalic and atraumatic.  Mouth/Throat: Oropharynx is clear and moist.  Eyes: Conjunctivae are normal. Pupils are equal, round, and reactive to light. Right eye exhibits no discharge. Left eye exhibits no discharge. No scleral icterus.  Neck: Neck supple.  Cardiovascular: Normal rate, regular rhythm and normal heart sounds.   Pulmonary/Chest: Effort normal and breath sounds normal. No respiratory distress. She has no wheezes. She has no rales.  Abdominal: Soft. There is no tenderness.  Musculoskeletal: She exhibits no tenderness.  Tenderness to light palpation of bilateral ankles. Distal pulses are intact. No overt erythema, edema or warmth noted.  Neurological: She is alert and oriented to person, place, and time.  Cranial Nerves II-XII grossly intact. Motor and sensation are baseline and intact.  Skin: Skin is warm and dry. No rash noted.  Psychiatric: She has a normal mood and affect.  Nursing note and vitals reviewed.   ED Course  Procedures (including critical care time) Labs Review Labs Reviewed - No data to display  Imaging Review No results found.   EKG Interpretation None     Meds given in ED:  Medications  HYDROmorphone (DILAUDID) injection 1 mg (1 mg Intramuscular Given 03/29/14 0104)    New Prescriptions   No medications on file   Filed Vitals:   03/28/14 1832 03/28/14 2140 03/29/14 0000 03/29/14 0100  BP:  127/100 133/89   Pulse:  105 99 96  Temp:  98.4 F (36.9 C)    TempSrc:  Oral    Resp:  18    Height: 5\' 4"  (1.626 m)     Weight: 143 lb (64.864 kg)     SpO2:  100% 100% 100%    MDM  Vitals stable - WNL -afebrile Pt resting comfortably in ED. PE-not concerning further acute or emergent pathology.  Patient experiencing acute exacerbation of chronic pain. Pain managed in the ED. Patient will be discharged home to follow-up with her PCP and/or pain clinic for further evaluation and management of her symptoms.  Discussed will not be able to prescribe outpatient narcotic therapy.  I discussed all relevant lab findings and imaging results with pt and they verbalized understanding. Discussed f/u with PCP within 48 hrs and return precautions, pt very amenable to plan. Prior to patient discharge, I discussed and reviewed this case with Dr. Hyacinth MeekerMiller    Final diagnoses:  Chronic pain       Earle GellBenjamin W Portisartner, PA-C 03/29/14 0112  Vida RollerBrian D Miller, MD 03/29/14 [phone]

## 2014-03-29 NOTE — ED Notes (Signed)
Reports in October of 2015 Complex Regional Pain Syndrome.

## 2014-04-01 ENCOUNTER — Emergency Department (HOSPITAL_COMMUNITY)
Admission: EM | Admit: 2014-04-01 | Discharge: 2014-04-01 | Disposition: A | Discharge status: Home / Self Care | Payer: 59 | Attending: Emergency Medicine | Admitting: Emergency Medicine

## 2014-04-01 ENCOUNTER — Encounter (HOSPITAL_COMMUNITY): Payer: Self-pay | Admitting: Emergency Medicine

## 2014-04-01 DIAGNOSIS — Z8639 Personal history of other endocrine, nutritional and metabolic disease: Secondary | ICD-10-CM | POA: Insufficient documentation

## 2014-04-01 DIAGNOSIS — Z8719 Personal history of other diseases of the digestive system: Secondary | ICD-10-CM | POA: Insufficient documentation

## 2014-04-01 DIAGNOSIS — Z8701 Personal history of pneumonia (recurrent): Secondary | ICD-10-CM | POA: Diagnosis not present

## 2014-04-01 DIAGNOSIS — Z79899 Other long term (current) drug therapy: Secondary | ICD-10-CM | POA: Insufficient documentation

## 2014-04-01 DIAGNOSIS — Z87442 Personal history of urinary calculi: Secondary | ICD-10-CM | POA: Diagnosis not present

## 2014-04-01 DIAGNOSIS — M79604 Pain in right leg: Secondary | ICD-10-CM | POA: Insufficient documentation

## 2014-04-01 DIAGNOSIS — G894 Chronic pain syndrome: Secondary | ICD-10-CM | POA: Diagnosis not present

## 2014-04-01 DIAGNOSIS — Z880 Allergy status to penicillin: Secondary | ICD-10-CM | POA: Insufficient documentation

## 2014-04-01 DIAGNOSIS — M79605 Pain in left leg: Secondary | ICD-10-CM | POA: Diagnosis not present

## 2014-04-01 DIAGNOSIS — Z720 Tobacco use: Secondary | ICD-10-CM | POA: Insufficient documentation

## 2014-04-01 DIAGNOSIS — F419 Anxiety disorder, unspecified: Secondary | ICD-10-CM | POA: Diagnosis not present

## 2014-04-01 DIAGNOSIS — M549 Dorsalgia, unspecified: Secondary | ICD-10-CM | POA: Diagnosis present

## 2014-04-01 DIAGNOSIS — Z8742 Personal history of other diseases of the female genital tract: Secondary | ICD-10-CM | POA: Insufficient documentation

## 2014-04-01 DIAGNOSIS — Z91040 Latex allergy status: Secondary | ICD-10-CM | POA: Insufficient documentation

## 2014-04-01 DIAGNOSIS — Z8709 Personal history of other diseases of the respiratory system: Secondary | ICD-10-CM | POA: Insufficient documentation

## 2014-04-01 DIAGNOSIS — G4700 Insomnia, unspecified: Secondary | ICD-10-CM | POA: Insufficient documentation

## 2014-04-01 MED ORDER — OXYCODONE HCL 5 MG PO TABS
10.0000 mg | ORAL_TABLET | Freq: Once | ORAL | Status: AC
  Administered 2014-04-01: 10 mg via ORAL
  Filled 2014-04-01: qty 2

## 2014-04-01 NOTE — ED Notes (Signed)
Staff called to room ,on arrival Family member requested that a MD eval PT .

## 2014-04-01 NOTE — Discharge Instructions (Signed)

## 2014-04-01 NOTE — Progress Notes (Signed)
Late entry for 1330 04/01/14   CARE MANAGEMENT ED NOTE 04/01/2014  Patient:  [name]   Account Number:  [id number]  Date Initiated:  04/01/2014  Documentation initiated by:  Edd ArbourGIBBS,Wakeelah Solan  Subjective/Objective Assessment:   49 yr old [employer] [facility] acute exacerbation of chronic pain syndrome. Reports that she does not have any more Percocet at home. Has appointment with pain management clinic on March 14.     Subjective/Objective Assessment Detail:   pcp Dr Antony HasteMichael Badger  susan reports pt hollering and screaming Reports pt has complex pain syndrome with a pmh of drug use [name] reports 04/01/14 "mark" her son was escorted out of ED because of his behavior [name] report pt not scheduled to be seen in pain clinic until another 2 weeks     Action/Plan:   ED Cm was transferrd a call from [facility] with [name] southern on the line She reviewed concerns about pt not getting care needed [name] requesting for pt to be admitted to hospital. CM listened to [name] and informed her that CM does not   Action/Plan Detail:   determine pt admission status Referred her to EDP Discussed pt needing medical reasons to be admitted that could not be care for on an outpatient level CM referred her to pcp for care until pain clinic appt   Anticipated DC Date:  04/01/2014     Status Recommendation to Physician:   Result of Recommendation:    Other ED Services  Consult Working Plan    DC Planning Services  Other  Outpatient Services - Pt will follow up    Choice offered to / List presented to:            Status of service:  Completed, signed off  ED Comments:   ED Comments Detail:

## 2014-04-01 NOTE — ED Provider Notes (Signed)
CSN: [id number]     Arrival date & time 04/01/14  [id number] History  This chart was scribed for non-physician practitioner Joycie PeekBenjamin Jiah Bari, working with No att. providers found by Carl Bestelina Holson, ED Scribe. This patient was seen in room TR10C/TR10C and the patient's care was started at 10:26 AM.   Chief Complaint  Patient presents with  . Leg Pain   Patient is a 49 y.o. female presenting with leg pain. The history is provided by the patient. No language interpreter was used.  Leg Pain Associated symptoms: back pain    HPI Comments: [name] [name] is a 49 y.o. female with a history of chronic pain who presents to the Emergency Department complaining of gradually worsening back pain radiating throughout her legs bilaterally.  She was seen by me in the ED on 2/25 and states that her symptoms started worsening 4-5 hours after discharge.  Per the patient's fiance, she is unable to eat and do daily activities due to pain.  she has an appointment with pain management on March 24.  Her PCP is Dr. Cyndia BentBadger who wrote her last prescription for Percocet.  She has taken all of the Percocet but states that it does not help her pain.  The patient's fiance states that she no longer takes Suboxone.    Past Medical History  Diagnosis Date  . Anxiety   . Thyroid disease   . Substance abuse   . Pneumonia   . Bronchitis   . Kidney stone   . Endometriosis   . Chronic pain   . GERD (gastroesophageal reflux disease)   . Gastritis, chronic   . PONV (postoperative nausea and vomiting)   . Insomnia   . Complex regional pain syndrome    Past Surgical History  Procedure Laterality Date  . Abdominal hysterectomy    . Cholecystectomy    . Appendectomy    . Ex laporotomy      x2-adhesion  . Knee arthroplasty      left knee age 89  . Finger arthrodesis      left ring  . Ankle reconstruction Right 08/24/2013    Procedure: RECONSTRUCTION ANKLE;  Surgeon: Harvie JuniorJohn L Graves, MD;  Location: [facility];  Service:  Orthopedics;  Laterality: Right;   Family History  Problem Relation Age of Onset  . Thyroid disease Mother   . CAD Mother   . Thyroid disease Sister   . CAD Other    History  Substance Use Topics  . Smoking status: Current Every Day Smoker -- 0.50 packs/day  . Smokeless tobacco: Never Used  . Alcohol Use: No     Comment: socially-no now   OB History    No data available     Review of Systems  Musculoskeletal: Positive for back pain and arthralgias.  All other systems reviewed and are negative.   Allergies  Sulfa antibiotics; Reglan; Clindamycin/lincomycin; Compazine; Latex; Morphine and related; Penicillins; and Zofran  Home Medications   Prior to Admission medications   Medication Sig Start Date End Date Taking? Authorizing Provider  baclofen (LIORESAL) 10 MG tablet Take 1 tablet (10 mg total) by mouth 3 (three) times daily. 02/06/14   Judi SaaZachary M Smith, DO  Buprenorphine HCl-Naloxone HCl (ZUBSOLV) 5.7-1.4 MG SUBL Place 1 tablet under the tongue daily.    Historical Provider, MD  butalbital-acetaminophen-caffeine (FIORICET) 50-325-40 MG per tablet Take 1 tablet by mouth every 8 (eight) hours as needed. headaches 01/22/14   Historical Provider, MD  clonazePAM (KLONOPIN) 1 MG tablet  TAKE ONE TABLET BY MOUTH TWICE DAILY AS NEEDED FOR ANXIETY 01/31/14   Historical Provider, MD  cyclobenzaprine (FLEXERIL) 10 MG tablet Take 1 tablet (10 mg total) by mouth 3 (three) times daily as needed for muscle spasms. 02/08/14   Judi Saa, DO  lidocaine (LIDODERM) 5 % Place 1 patch onto the skin daily. Remove & Discard patch within 12 hours or as directed by MD Patient not taking: Reported on 03/28/2014 11/23/13   Judi Saa, DO  LYRICA 100 MG capsule TAKE ONE CAPSULE BY MOUTH THREE TIMES DAILY 01/10/14   Judi Saa, DO  zolpidem (AMBIEN) 10 MG tablet TAKE ONE TABLET BY MOUTH AT BEDTIME AS NEEDED 01/31/14   Historical Provider, MD   Triage Vitals: BP 148/105 mmHg  Pulse 100   Temp(Src) 98.7 F (37.1 C) (Oral)  Resp 18  Ht  (1.626 m)  Wt 140 lb (63.504 kg)  BMI 24.02 kg/m2  SpO2 100%  Physical Exam  Constitutional: She is oriented to person, place, and time. She appears well-developed and well-nourished.  HENT:  Head: Normocephalic and atraumatic.  Mouth/Throat: Oropharynx is clear and moist.  Eyes: Conjunctivae are normal. Pupils are equal, round, and reactive to light. Right eye exhibits no discharge. Left eye exhibits no discharge. No scleral icterus.  Neck: Neck supple.  Cardiovascular: Normal rate, regular rhythm, normal heart sounds and intact distal pulses.   Pulmonary/Chest: Effort normal and breath sounds normal. No respiratory distress. She has no wheezes. She has no rales.  Abdominal: Soft. There is no tenderness.  Musculoskeletal:  Complains of extreme tenderness to palpation of bilateral lower extremities. No lesions or erythema or warmth appreciated. No source of discomfort identified.  Neurological: She is alert and oriented to person, place, and time.  Cranial Nerves II-XII grossly intact. Motor and sensation 5/5 in all 4 extremities.  Skin: Skin is warm and dry. No rash noted.  Psychiatric: She has a normal mood and affect.  Nursing note and vitals reviewed.   ED Course  Procedures (including critical care time)  DIAGNOSTIC STUDIES: Oxygen Saturation is 100% on room air, normal by my interpretation.    COORDINATION OF CARE: 10:37 AM- Advised patient's fiance that we cannot prescribe any outpatient medications due to her current prescription for Percocet.  Will treat patient's symptoms will oral medication in the ED and the patient's fiance agreed to the treatment plan.  Labs Review Labs Reviewed - No data to display  Imaging Review No results found.   EKG Interpretation None     Meds given in ED:  Medications  oxyCODONE (Oxy IR/ROXICODONE) immediate release tablet 10 mg (10 mg Oral Given 04/01/14 1052)    Discharge  Medication List as of 04/01/2014 11:08 AM     Filed Vitals:   04/01/14 1008 04/01/14 1114  BP: 148/105 143/94  Pulse: 100 107  Temp: 98.7 F (37.1 C)   TempSrc: Oral   Resp: 18 22  Height:  (1.626 m)   Weight: 140 lb (63.504 kg)   SpO2: 100% 99%    MDM  Patient here with acute exacerbation of chronic pain syndrome. Reports that she does not have any more Percocet at home. Has appointment with pain management clinic on March 14. Patient with outstanding narcotic prescription on 2/15 from her PCP for 90 Percocet. Discussed will not be able to provide outpatient narcotic prescription. Attempted pain control with oral medications in fast track area. Patient is adamant about receiving IV Dilaudid. Reports he will  check back into ED. Vital signs are stable at this time, tachycardia likely due to pain. Physical exam is noncontributory and is essentially unchanged from previous when she was seen on 2/26 for same symptoms. No evidence of acute or emergent pathology at this time.  Final diagnoses:  Chronic pain disorder    I personally performed the services described in this documentation, which was scribed in my presence. The recorded information has been reviewed and is accurate.   Earle Gell [place]mswick, PA-C 04/01/14 1806  Hilario Quarry, MD 04/02/14 [phone]

## 2014-04-01 NOTE — ED Notes (Signed)
Pt c/o chronic bilateral leg pain since having sx in July; pt sts out of percocet; pt sts has chronic pain disorder that is spreading to both legs

## 2014-04-01 NOTE — ED Notes (Signed)
Security and [employer] at bedside, pt agitated and yelling. Fiance at bedside stating that he wants pt moved to more acute side, this RN explained triage process, pt fiance cursing at this RN. Wheelchair provided for pt and pt escorted to waiting room by this RN and security. Fiance yelling at nurse first RN, pt and family escorted out of building. NAD noted.

## 2014-04-26 IMAGING — CR DG CHEST 2V
2 series · 2 of 2 positions shown · non-contrast
Comparison: None.

CLINICAL DATA: Right lower chest pain, shortness of breath,
currently on treatment for pneumonia, history smoking

CHEST - 2 VIEW

[w chest pa]
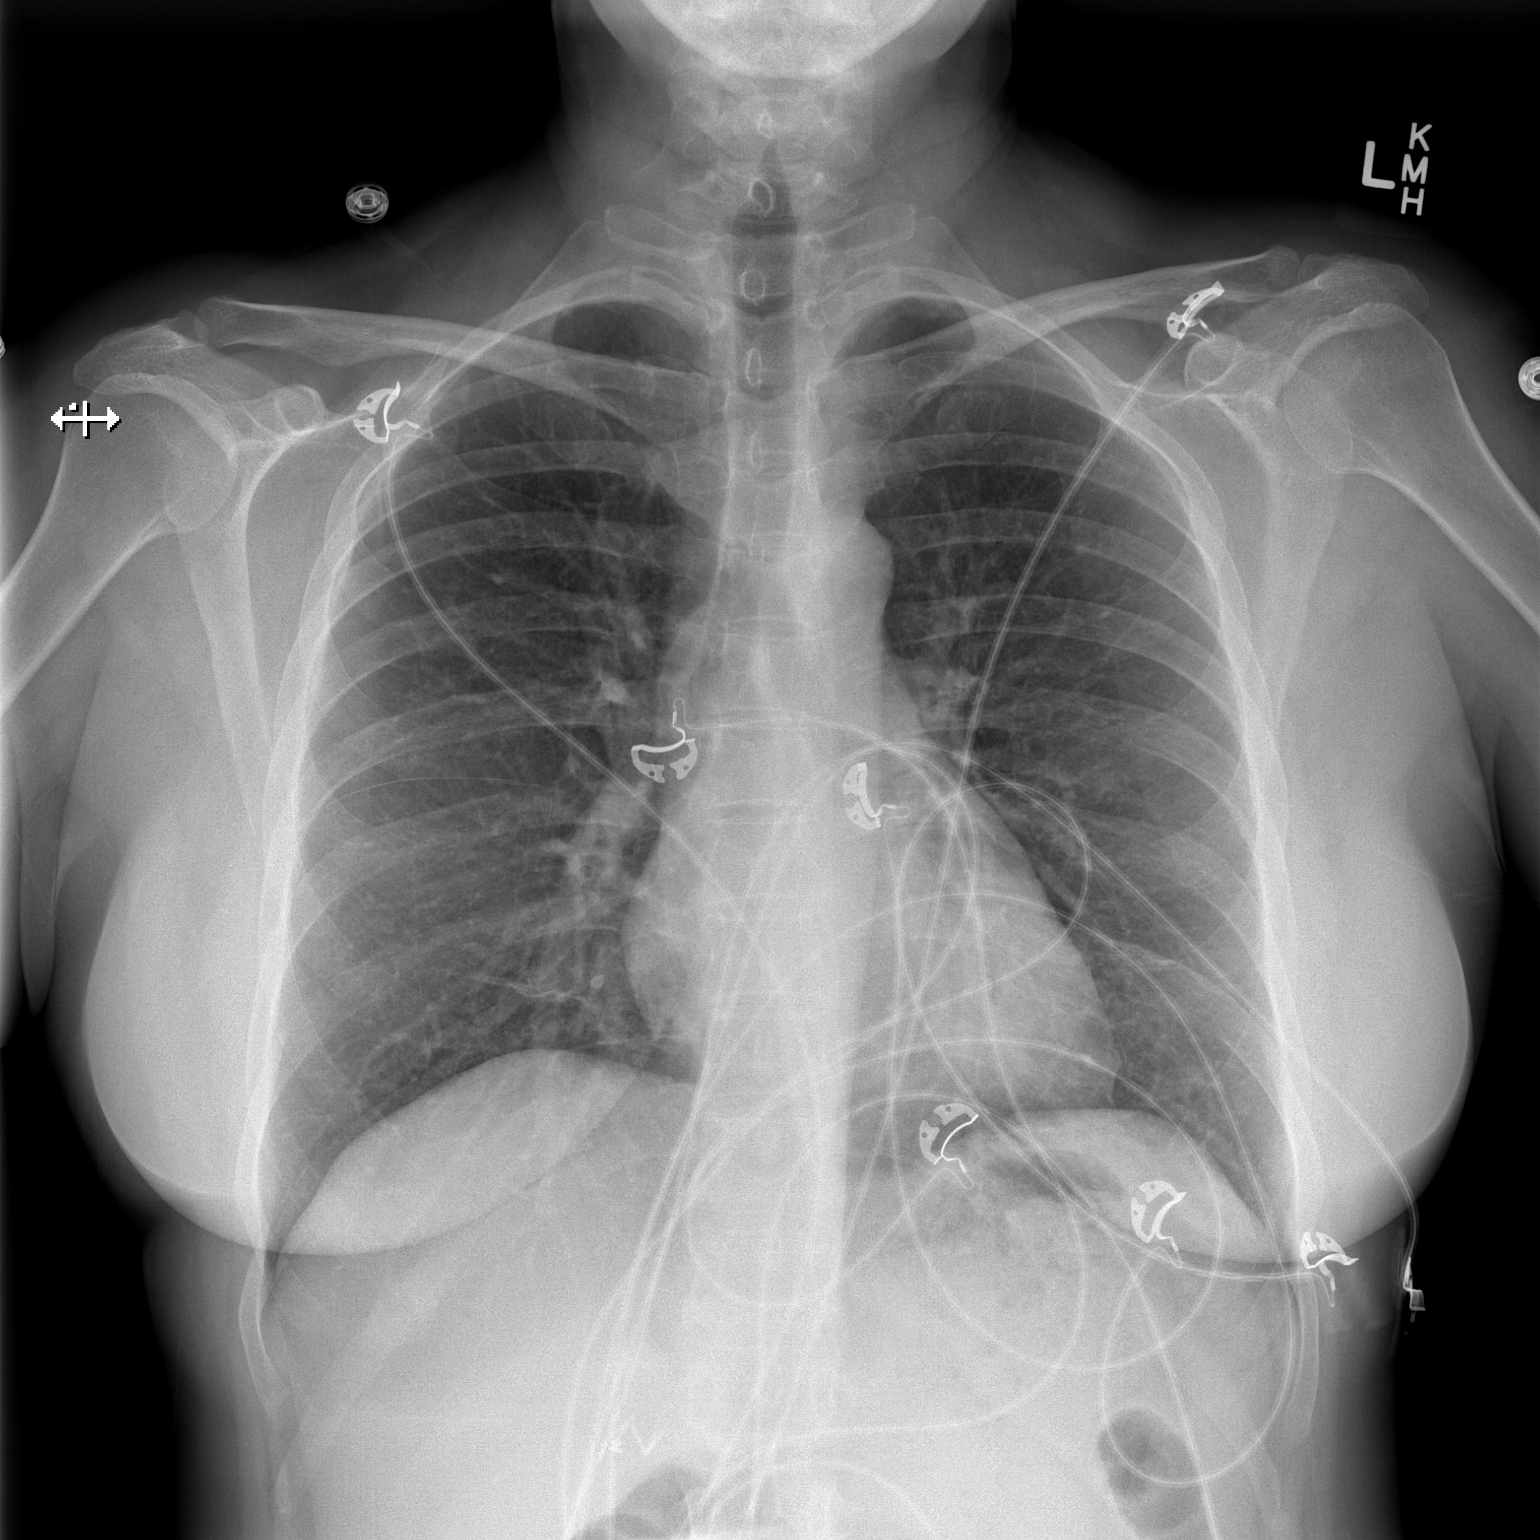

[w chest lat]
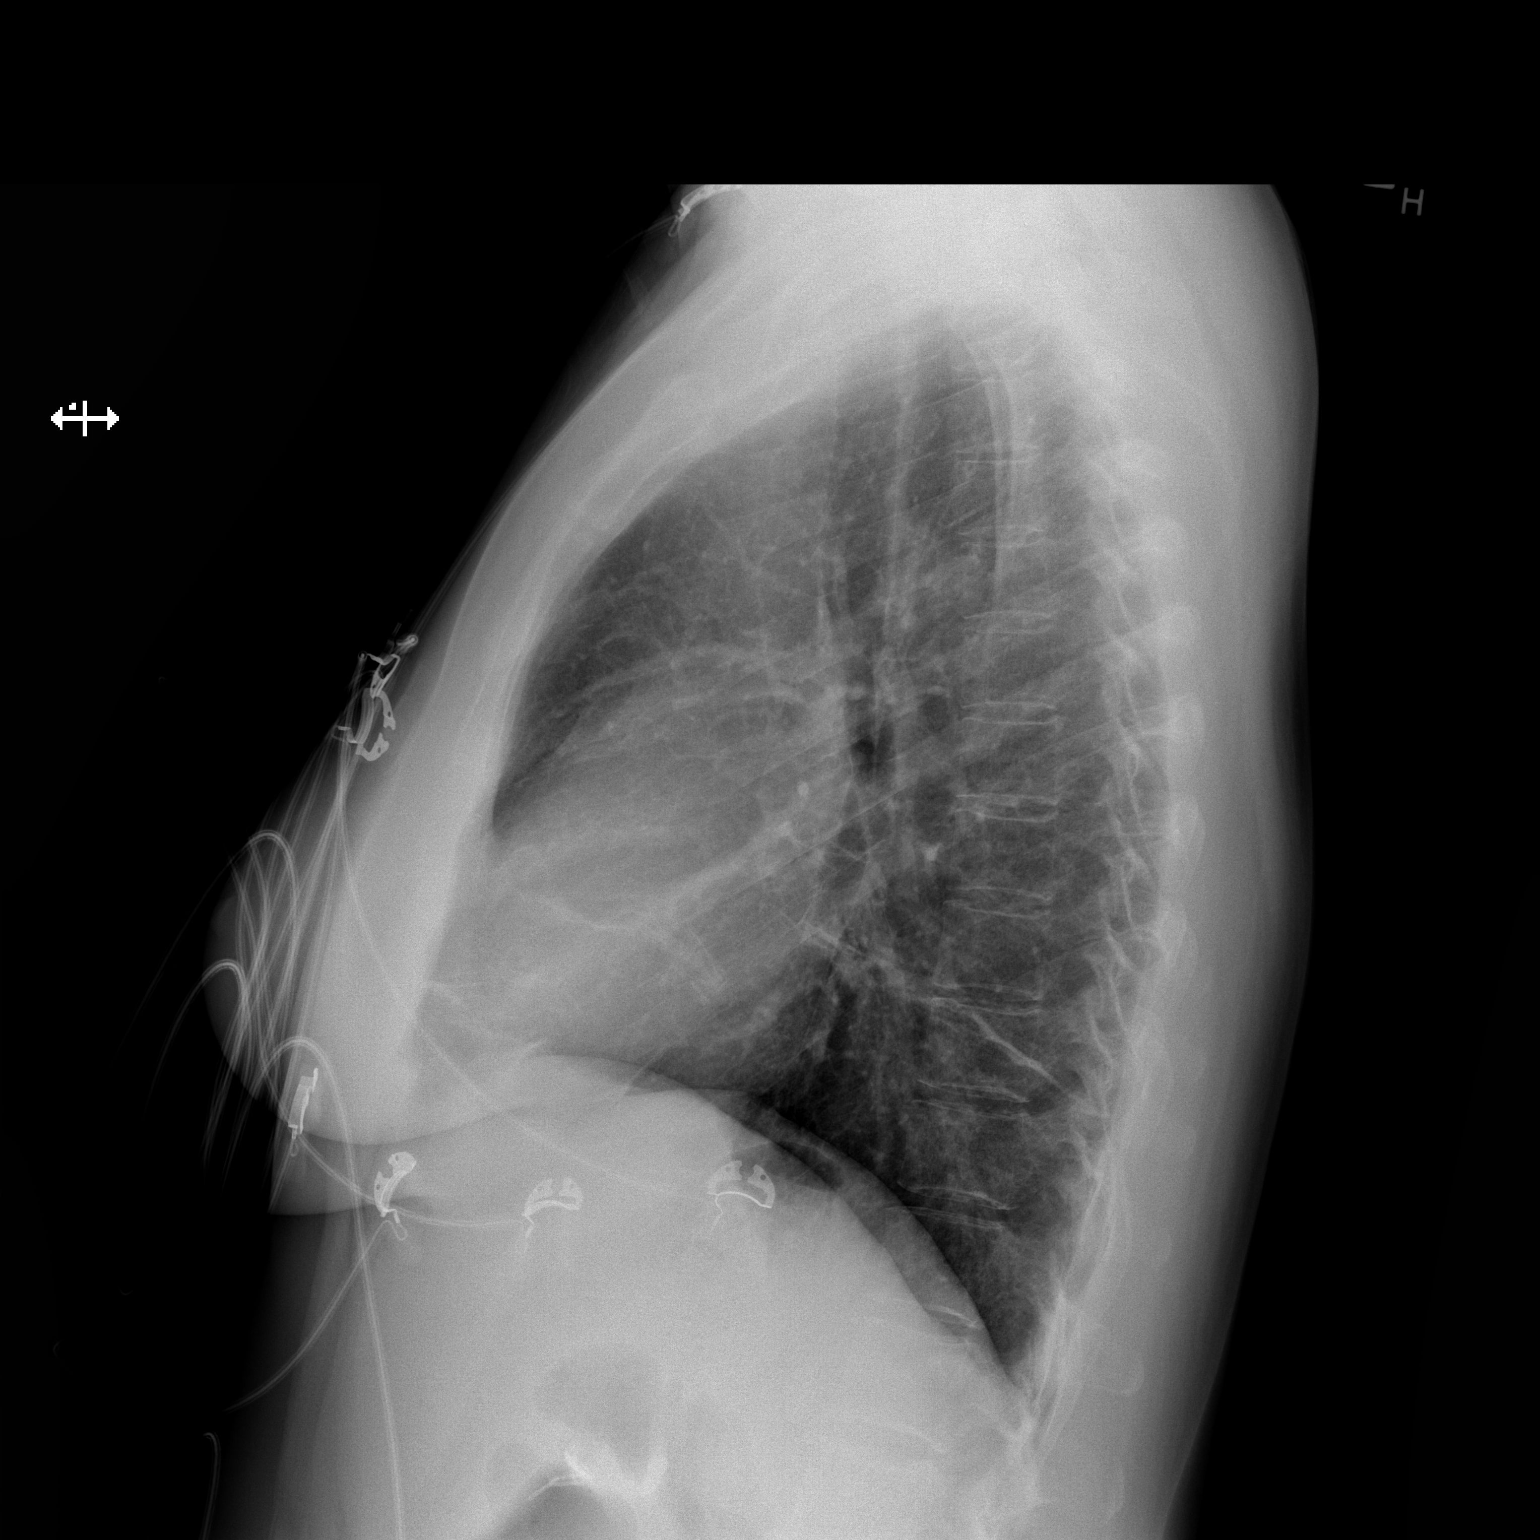

[2 of 2 positions shown; findings below may reference images not displayed]

FINDINGS: Normal heart size, mediastinal contours, and pulmonary vascularity.
Minimal atelectasis at lung bases.
Lungs otherwise clear.
Resolution of previously identified left basilar consolidation.
No new areas of infiltrate, pleural effusion, or pneumothorax.
Bones unremarkable.
IMPRESSION: Minimal atelectasis at lung bases.
Improved left basilar aeration.

## 2014-05-24 NOTE — Consult Note (Signed)
Brief Consult Note: Diagnosis: Benzodiazepine dependence, depressive disorder NOS.   Patient was seen by consultant.   Consult note dictated.   Recommend further assessment or treatment.   Discussed with Attending MD.   Comments: Ms. Dubois was dischasrged from [facility] on 02/17/12. She was brought to our homeless shelter with a month supply of medications but no doctors appointments. She was overusing clonazepam at the shelter. Her clonazepam was eventually stgolen. She met with [facility], our local provider, representative. She was advised to go to [facility] for benzodiazepine detox as they will not be able to provide it in the future even if she becomes a patient there. Instead of [facility], she arrived here and demands prescription for clonazepam. She is not suicidal or homicidal. She adamantly denies detox here or at [facility].   PLAN: 1. I will not provide a prescription fortf clonazepam for this patient with a history of abuse.  2. She could be admitted here or at [facility] for detox if she wants to.  Electronic Signatures: ,  (MD)  (Signed 24-Jan-14 17:44)  Authored: Brief Consult Note   Last Updated: 24-Jan-14 17:44 by ,  (MD) 

## 2014-09-07 IMAGING — CT CT ABD-PELV W/ CM
1 of 4 series · 14 of 32 positions shown, 19 images · IV contrast (OMNIPAQUE 300)
Comparison: 07/13/2012

CLINICAL DATA: Postprandial epigastric pain.  Weight loss.

EXAM:
CT ABDOMEN AND PELVIS WITH CONTRAST
TECHNIQUE: Multidetector CT imaging of the abdomen and pelvis was performed
using the standard protocol following bolus administration of
intravenous contrast.
CONTRAST:  50mL OMNIPAQUE IOHEXOL 300 MG/ML SOLN, 100mL OMNIPAQUE
IOHEXOL 300 MG/ML SOLN

[Series 2: abd/pel with · axial · 0.69mm/px · z∈[-409,-9]mm · 14 of 92 slices shown, 19 images]
[im 6/92  soft-tissue]
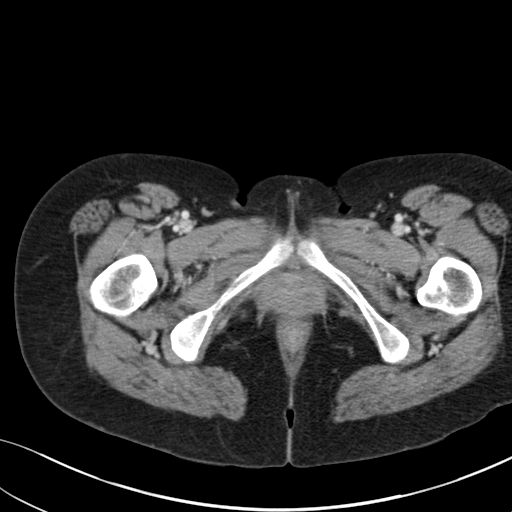
[im 6/92  bone]
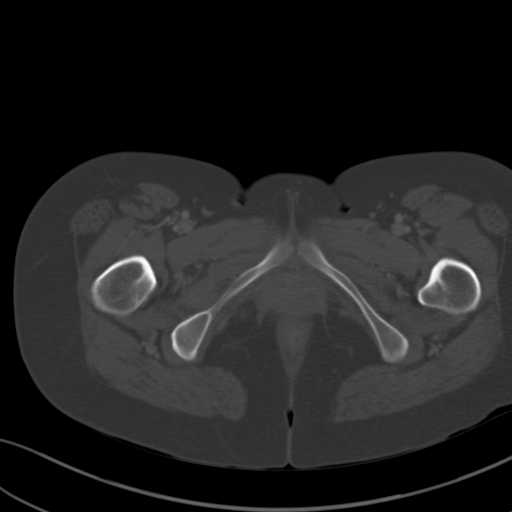
[im 11/92  soft-tissue]
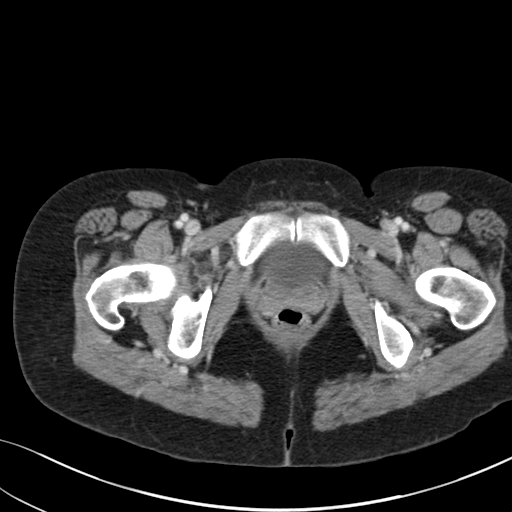
[im 21/92  soft-tissue]
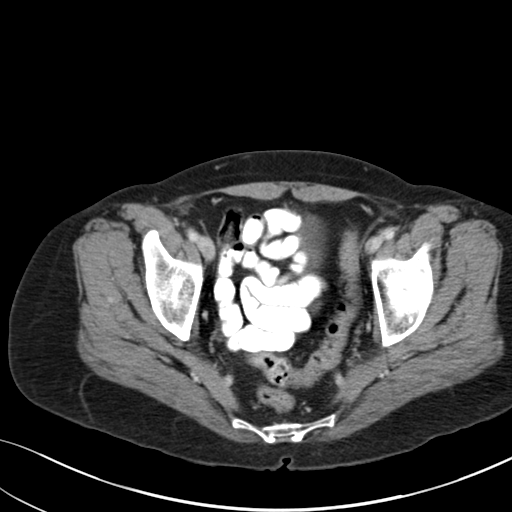
[im 26/92  soft-tissue]
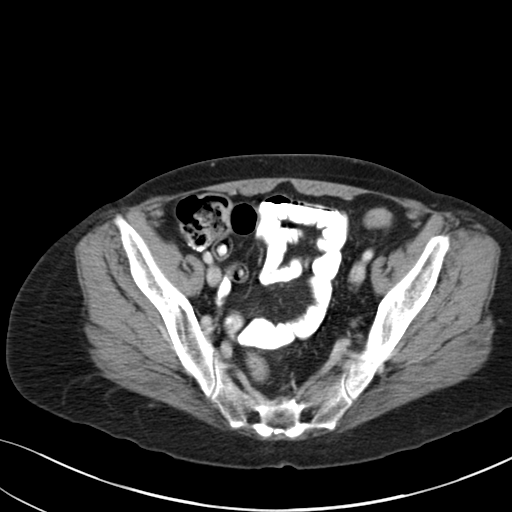
[im 31/92  soft-tissue]
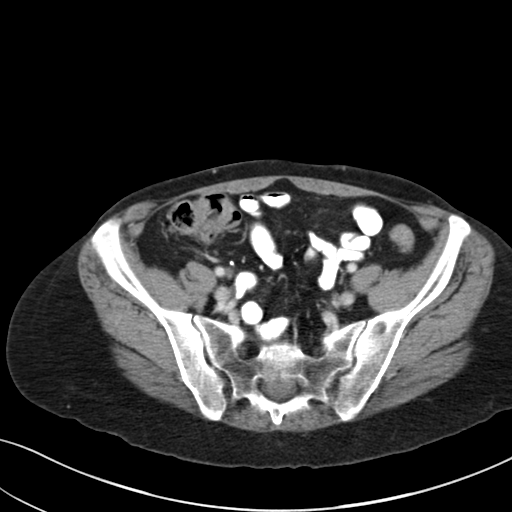
[im 41/92  soft-tissue]
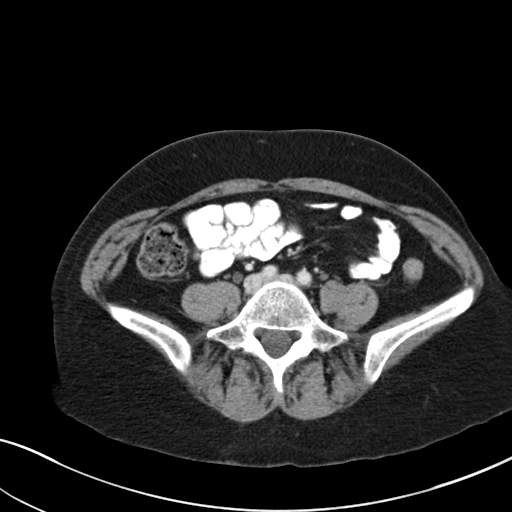
[im 46/92  soft-tissue]
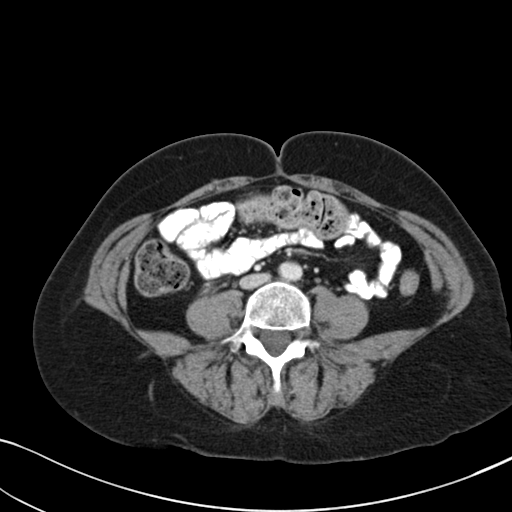
[im 51/92  soft-tissue]
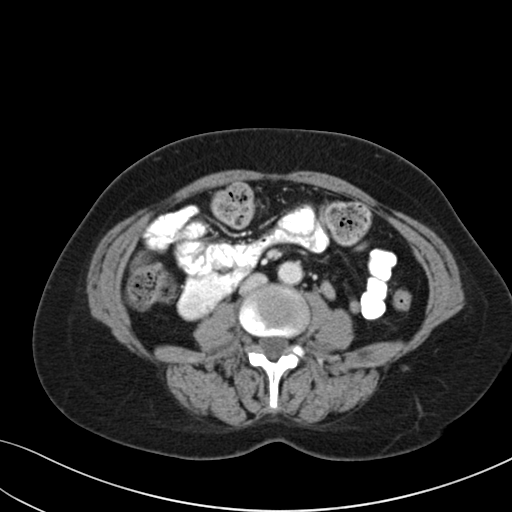
[im 61/92  soft-tissue]
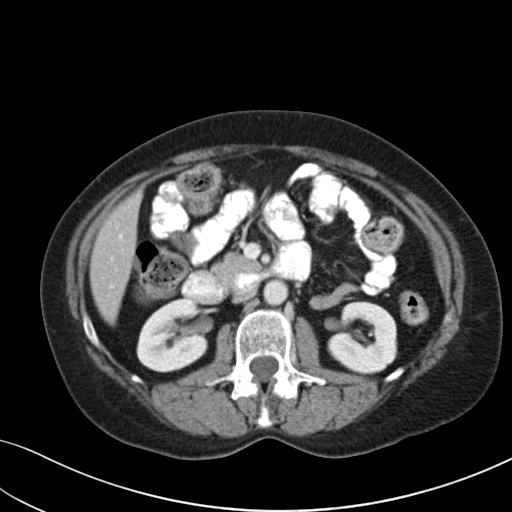
[im 61/92  bone]
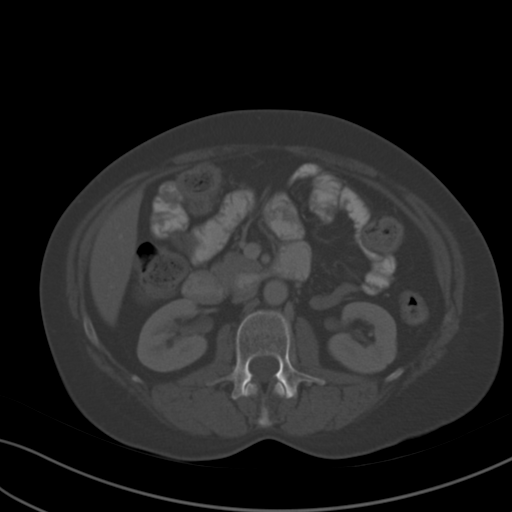
[im 66/92  soft-tissue]
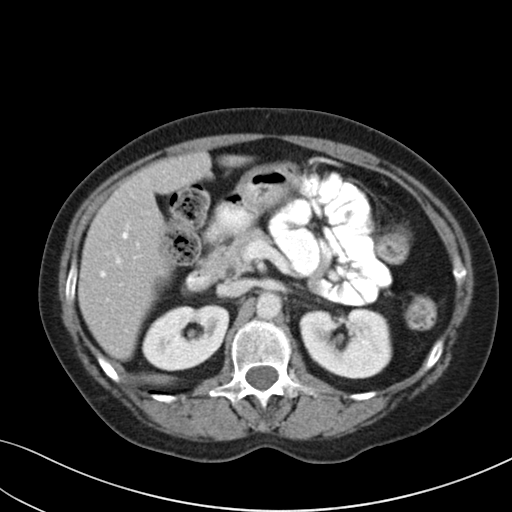
[im 71/92  soft-tissue]
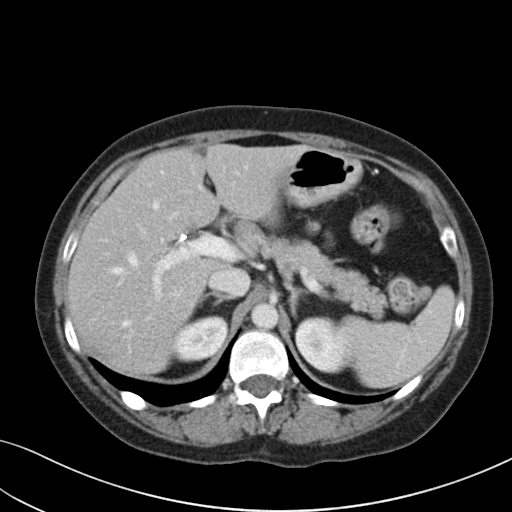
[im 71/92  lung]
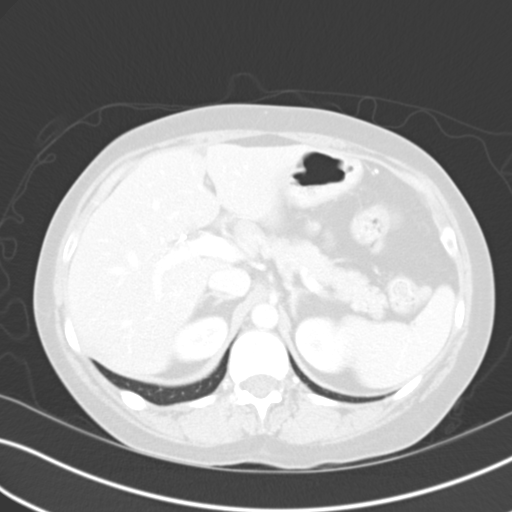
[im 76/92  lung]
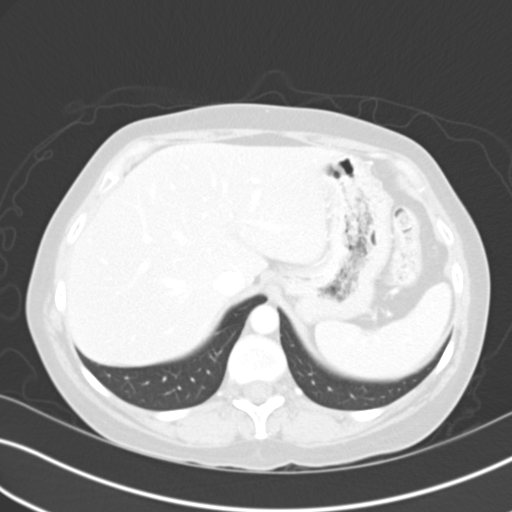
[im 81/92  soft-tissue]
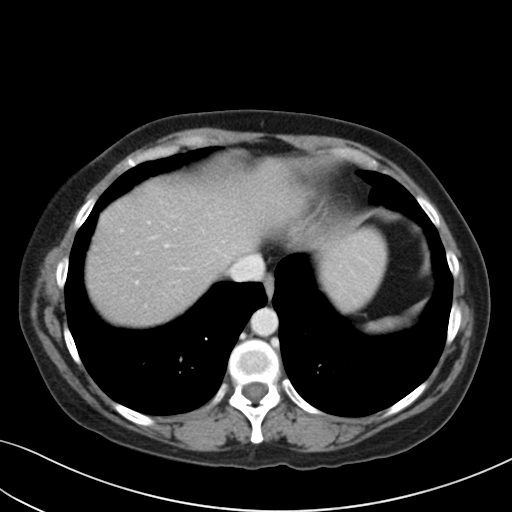
[im 81/92  lung]
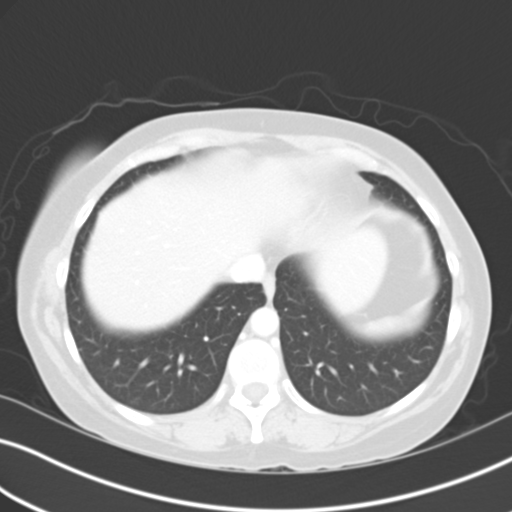
[im 86/92  soft-tissue]
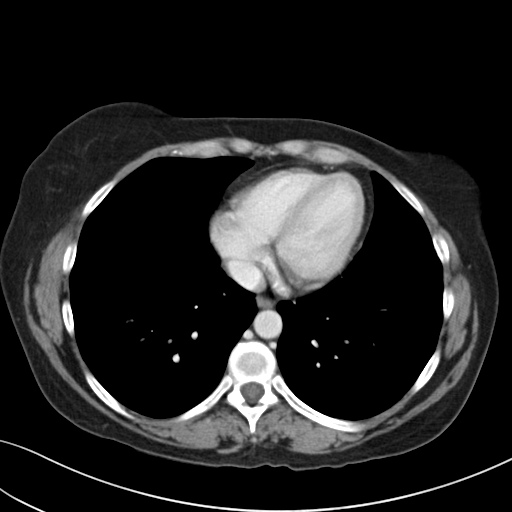
[im 86/92  lung]
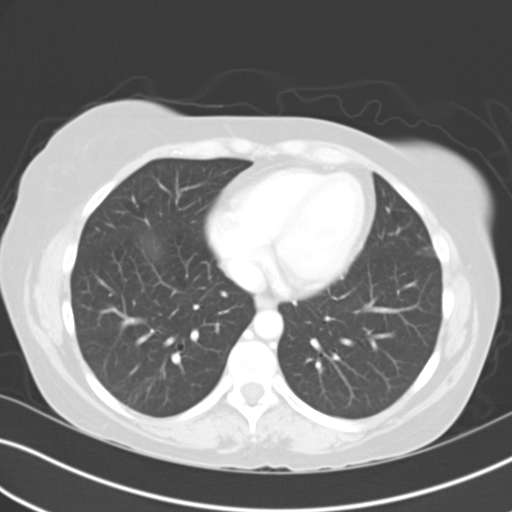

[14 of 32 positions shown; findings below may reference images not displayed]

FINDINGS: Healed fracture of the right 11th rib posteriorly.

Faint hypodensity of the along the gallbladder fossa and falciform
ligament compatible with hepatic steatosis.

Spleen, pancreas, and adrenal glands normal. Gallbladder surgically
absent. No significant abnormal biliary dilatation.

Fluid density 1.4 x 0.8 cm hypodense lesion in the left mid to lower
kidney, highly likely to be a cyst although volume averaging makes
density measurement confirmation problematic. Back in 0112 this
lesion was hyperdense compatible with complex cyst. No particularly
worrisome features today. Kidneys otherwise unremarkable.

No pathologic upper abdominal adenopathy is observed. No pathologic
pelvic adenopathy is observed. Urinary bladder unremarkable. Uterus
absent. Ovaries not well seen.

Appendix surgically absent. No significantly dilated bowel observed.
No free pelvic fluid.
IMPRESSION: 1. No specific abnormality to explain the patient's postprandial
epigastric pain.
2. Right kidney lower pole cyst, previously shown to be complex back
in 0112, but not significantly increased in size since that time.

## 2015-01-22 DIAGNOSIS — R52 Pain, unspecified: Secondary | ICD-10-CM
